# Patient Record
Sex: Female | Born: 1990 | Race: White | Hispanic: No | Marital: Single | Smoking: Heavy tobacco smoker
Health system: Southern US, Community
[De-identification: ages and names within clinical notes are randomized; demographics above are authoritative.]

## PROBLEM LIST (undated history)

## (undated) ENCOUNTER — Inpatient Hospital Stay: Payer: Self-pay

## (undated) DIAGNOSIS — M1990 Unspecified osteoarthritis, unspecified site: Secondary | ICD-10-CM

## (undated) HISTORY — PX: CHOLECYSTECTOMY: SHX55

## (undated) HISTORY — PX: RECONSTRUCTION OF NOSE: SHX2301

## (undated) HISTORY — PX: NASAL FRACTURE SURGERY: SHX718

---
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
## 2008-01-25 ENCOUNTER — Ambulatory Visit: Payer: Self-pay | Admitting: Diagnostic Radiology

## 2008-01-25 ENCOUNTER — Emergency Department (HOSPITAL_BASED_OUTPATIENT_CLINIC_OR_DEPARTMENT_OTHER): Admission: EM | Admit: 2008-01-25 | Discharge: 2008-01-25 | Payer: Self-pay | Admitting: Emergency Medicine

## 2008-01-29 ENCOUNTER — Emergency Department (HOSPITAL_BASED_OUTPATIENT_CLINIC_OR_DEPARTMENT_OTHER): Admission: EM | Admit: 2008-01-29 | Discharge: 2008-01-29 | Payer: Self-pay | Admitting: Emergency Medicine

## 2008-03-18 IMAGING — US ABDOMEN ULTRASOUND
1 series · 17 of 25 positions shown · non-contrast
Comparison: none

REASON FOR EXAM: abd pain    nausea    diarrhea
COMMENTS:

[Series 1: abdomen ultrasound · 17 of 57 slices shown]
[im 1/57]
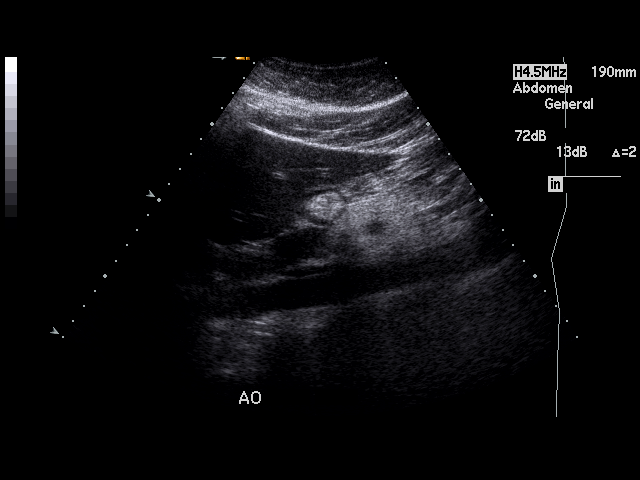
[im 5/57]
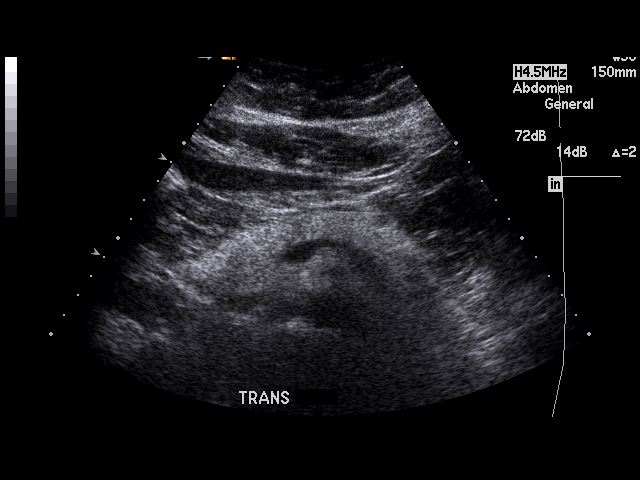
[im 8/57]
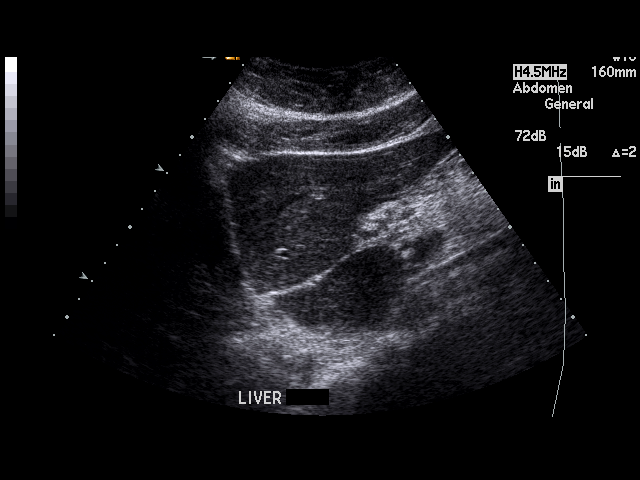
[im 12/57]
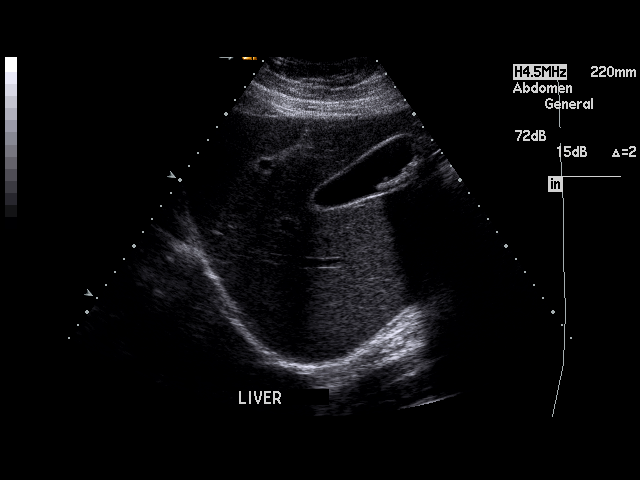
[im 15/57]
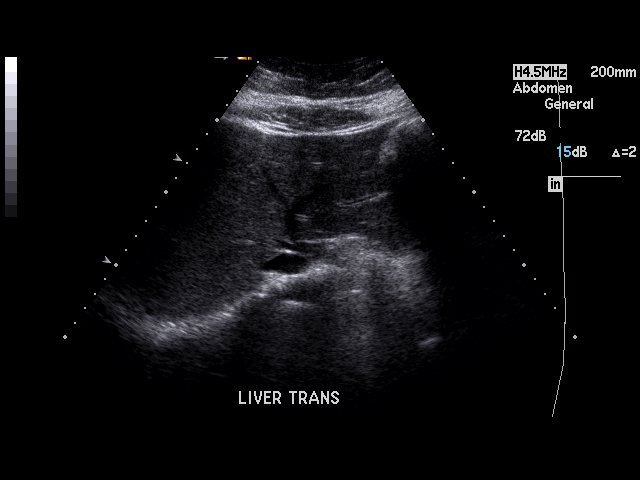
[im 19/57]
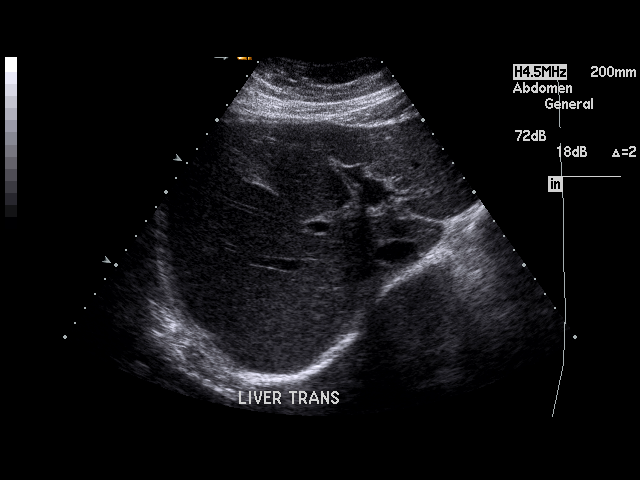
[im 22/57]
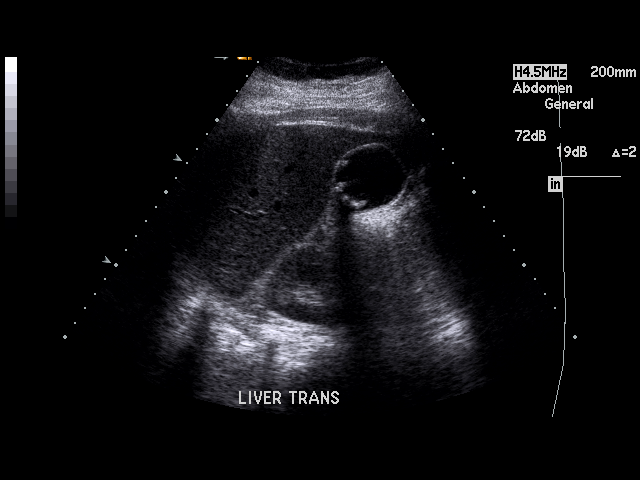
[im 26/57]
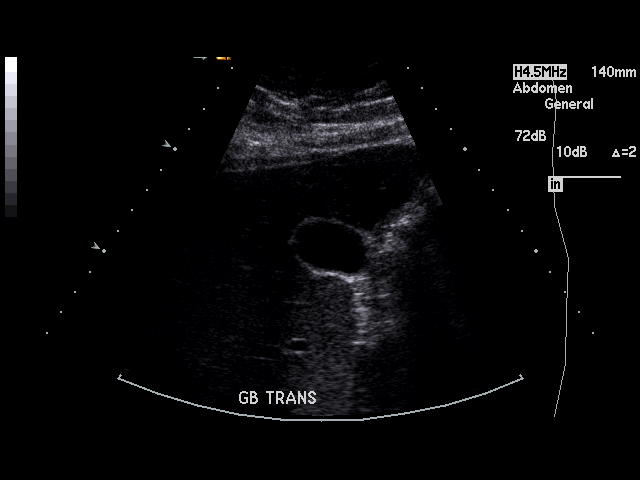
[im 29/57]
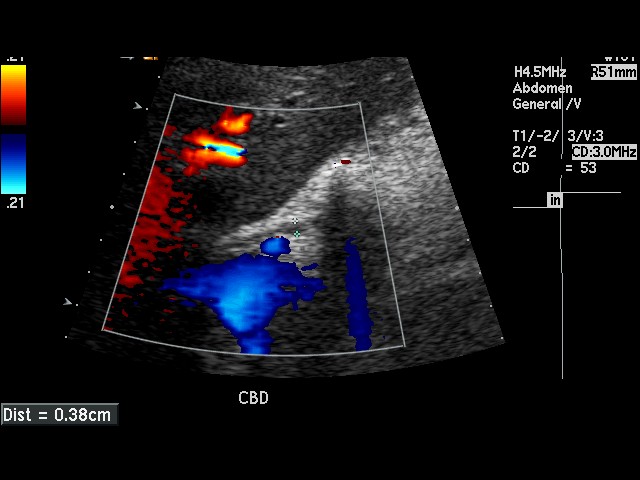
[im 31/57]
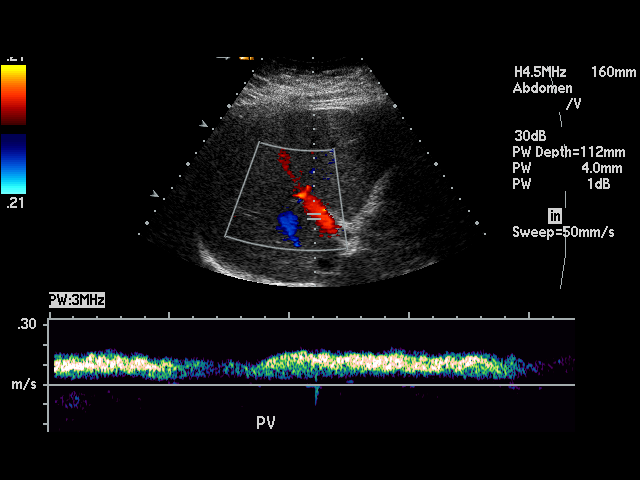
[im 36/57]
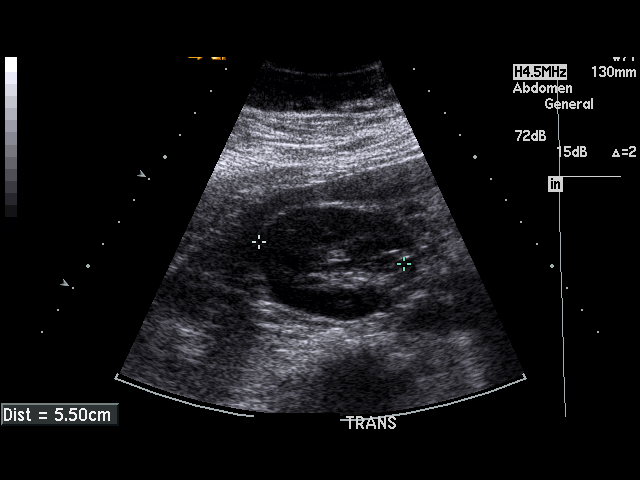
[im 38/57]
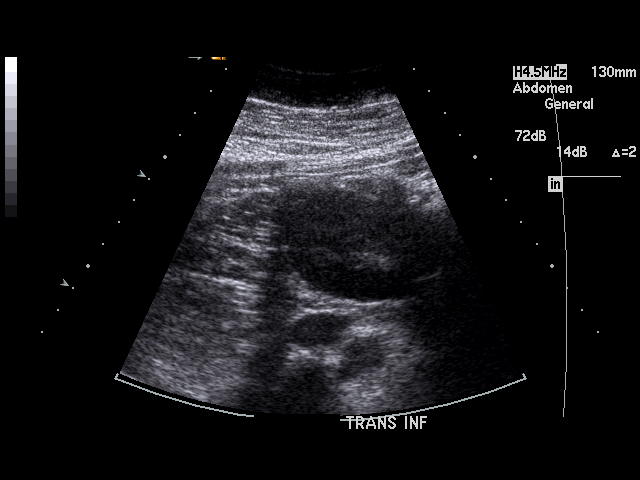
[im 43/57]
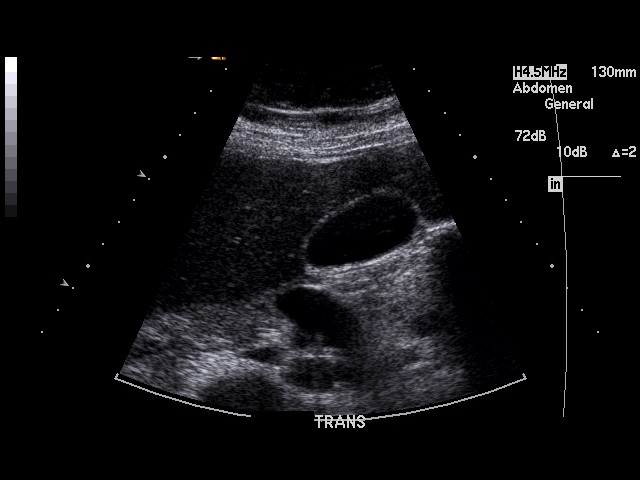
[im 45/57]
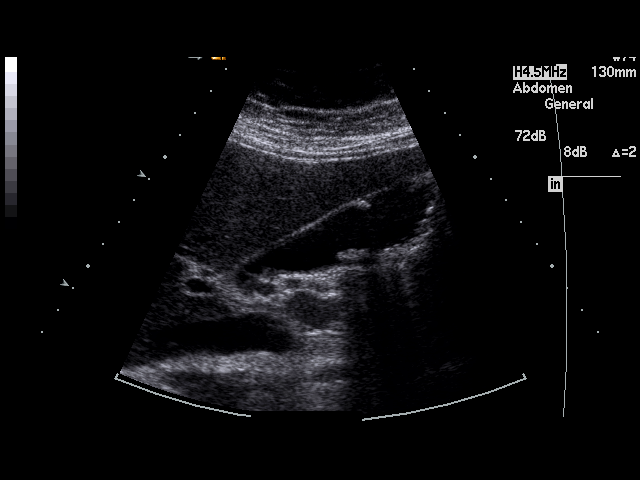
[im 50/57]
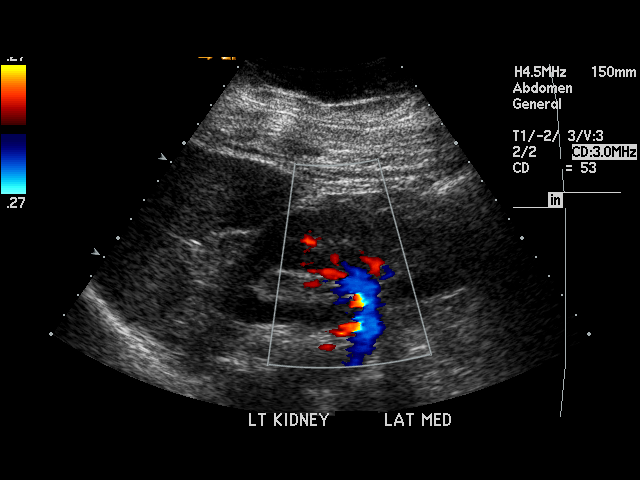
[im 52/57]
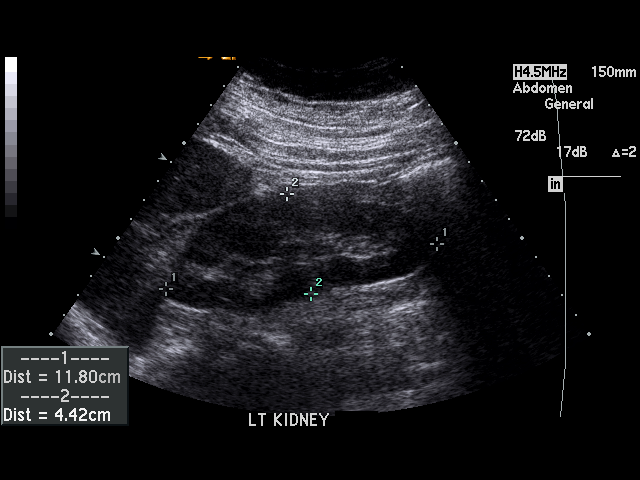
[im 57/57]
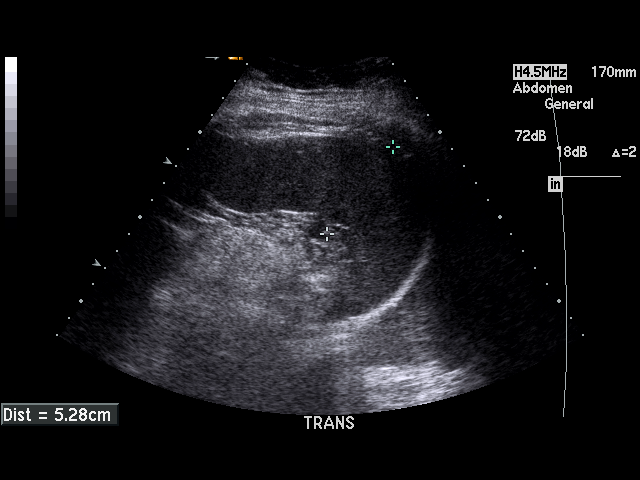

[17 of 25 positions shown; findings below may reference images not displayed]

PROCEDURE:     US  - US ABDOMEN GENERAL SURVEY  - March 01, 2007  [DATE]

RESULT:     Sonographic evaluation of the abdomen demonstrates the presence
of stones within the gallbladder with shadowing. These appear to be mobile.
There is some anterior echogenicity in the gallbladder wall which is
nonmobile. This could represent cholesterolosis or polyposis. The
gallbladder wall thickness is normal at 2.4 mm. The common bile duct
diameter measures 3.8 mm. The pancreas is only partially seen. The liver,
portal venous flow, spleen and kidneys appear to be normal. The visualized
portions of the aorta and inferior vena cava appear to be normal.
IMPRESSION: 1. Cholelithiasis.
2. Possible gallbladder polyp or cholesterolosis.
3. No evidence of acute cholecystitis sonographically.

## 2008-06-23 ENCOUNTER — Emergency Department (HOSPITAL_BASED_OUTPATIENT_CLINIC_OR_DEPARTMENT_OTHER): Admission: EM | Admit: 2008-06-23 | Discharge: 2008-06-23 | Payer: Self-pay | Admitting: Emergency Medicine

## 2008-06-25 ENCOUNTER — Emergency Department (HOSPITAL_BASED_OUTPATIENT_CLINIC_OR_DEPARTMENT_OTHER): Admission: EM | Admit: 2008-06-25 | Discharge: 2008-06-25 | Payer: Self-pay | Admitting: Emergency Medicine

## 2009-01-19 ENCOUNTER — Encounter: Admission: RE | Admit: 2009-01-19 | Discharge: 2009-01-19 | Payer: Self-pay | Admitting: Sports Medicine

## 2009-04-16 ENCOUNTER — Encounter: Admission: RE | Admit: 2009-04-16 | Discharge: 2009-04-16 | Payer: Self-pay | Admitting: Sports Medicine

## 2009-05-18 ENCOUNTER — Encounter: Admission: RE | Admit: 2009-05-18 | Discharge: 2009-05-18 | Payer: Self-pay | Admitting: Sports Medicine

## 2009-09-28 ENCOUNTER — Encounter: Admission: RE | Admit: 2009-09-28 | Discharge: 2009-09-28 | Payer: Self-pay | Admitting: Sports Medicine

## 2009-10-06 ENCOUNTER — Encounter: Admission: RE | Admit: 2009-10-06 | Discharge: 2009-10-06 | Payer: Self-pay | Admitting: Family Medicine

## 2009-10-08 ENCOUNTER — Ambulatory Visit (HOSPITAL_COMMUNITY): Admission: RE | Admit: 2009-10-08 | Discharge: 2009-10-08 | Payer: Self-pay | Admitting: Family Medicine

## 2009-11-04 ENCOUNTER — Encounter: Admission: RE | Admit: 2009-11-04 | Discharge: 2009-11-04 | Payer: Self-pay | Admitting: Family Medicine

## 2010-01-23 ENCOUNTER — Encounter: Payer: Self-pay | Admitting: Sports Medicine

## 2010-01-23 ENCOUNTER — Emergency Department (HOSPITAL_BASED_OUTPATIENT_CLINIC_OR_DEPARTMENT_OTHER)
Admission: EM | Admit: 2010-01-23 | Discharge: 2010-01-23 | Payer: Self-pay | Source: Home / Self Care | Admitting: Emergency Medicine

## 2010-01-25 ENCOUNTER — Ambulatory Visit
Admission: RE | Admit: 2010-01-25 | Discharge: 2010-01-25 | Payer: Self-pay | Source: Home / Self Care | Attending: Sports Medicine | Admitting: Sports Medicine

## 2010-01-25 ENCOUNTER — Ambulatory Visit (HOSPITAL_BASED_OUTPATIENT_CLINIC_OR_DEPARTMENT_OTHER)
Admission: RE | Admit: 2010-01-25 | Discharge: 2010-01-25 | Payer: Self-pay | Source: Home / Self Care | Attending: Family Medicine | Admitting: Family Medicine

## 2010-01-25 DIAGNOSIS — M79609 Pain in unspecified limb: Secondary | ICD-10-CM | POA: Insufficient documentation

## 2010-01-25 LAB — PREGNANCY, URINE: Preg Test, Ur: NEGATIVE

## 2010-01-31 ENCOUNTER — Encounter: Payer: Self-pay | Admitting: Family Medicine

## 2010-02-01 ENCOUNTER — Encounter
Admission: RE | Admit: 2010-02-01 | Discharge: 2010-02-01 | Payer: Self-pay | Source: Home / Self Care | Attending: Sports Medicine | Admitting: Sports Medicine

## 2010-02-01 ENCOUNTER — Ambulatory Visit
Admission: RE | Admit: 2010-02-01 | Discharge: 2010-02-01 | Payer: Self-pay | Source: Home / Self Care | Attending: Family Medicine | Admitting: Family Medicine

## 2010-02-03 NOTE — Assessment & Plan Note (Addendum)
Summary: ED FLUID ON KNEE/DRAIN/MJD   Vital Signs:  Patient profile:   20 year old female Height:      65 inches (165.10 cm) Weight:      270 pounds (122.73 kg) BMI:     45.09 Temp:     97.6 degrees F (36.44 degrees C) oral Pulse rate:   99 / minute BP sitting:   165 / 125  (left arm)  Vitals Entered By: Baxter Hire) (January 25, 2010 11:29 AM) CC: ED Fluid on Right knee Pain Assessment Patient in pain? yes     Location: right knee Intensity: 9 Onset of pain  Has been swollen since Sunday Nutritional Status BMI of > 30 = obese  Does patient need assistance? Functional Status Self care Ambulation Normal   CC:  ED Fluid on Right knee.  History of Present Illness: 20 yo F here with right lower extremity pain  Patient reports that on Sunday early morning she woke up with right lower leg pain This did wake her up from sleep No known injury or remote injury. Slowly worsened since that time. No bruising though she states it feels swollen. No true catching or locking. ROM is limited Is on implanon but no estrogen based birth control No long car rides Went to ED - had x-rays of knee that were negative without fracture, effusion, DJD, or other abnormalities Placed in knee immobilizer and has been wearing this. Declined crutches.  Habits & Providers  Alcohol-Tobacco-Diet     Tobacco Status: current     Cigarette Packs/Day: 0.5  Problems Prior to Update: 1)  Leg Pain, Right  (ICD-729.5)  Allergies (verified): 1)  ! Benadryl 2)  ! Darvocet 3)  ! Vicodin  Family History: + DM grandmother + heart disease grandmother Negative for HTN  Social History: smoker - 1/2 ppd x 3 years no alcohol use MSG servicesSmoking Status:  current Packs/Day:  0.5  Physical Exam  General:  overweight, NAD, ambulates with limp Msk:  R knee: No gross deformity, swelling, or bruising.  No bakers cyst palpated.  No erythema or warmth. TTP diffusely distal quad anteriorly  and posteriorly, posterior knee.  Minimal proximal calf TTP. ROM full extension but limited to 30 degrees flexion 2/2 pain Stable to valgus/varus stress. Negative lachmanns and ant/post drawer (could not bend fully to 90 degrees 2/2 pain per patient). Apleys with pain but not at joint lines. NVI distally. Negative logroll right hip.   Impression & Recommendations:  Problem # 1:  LEG PAIN, RIGHT (ICD-729.5) Patient's insidious onset of pain within right lower leg without injury, posterior leg pain was concerning for possible DVT so doppler u/s done - negative for DVT.  X-rays reviewed and no evidence of DJD - exam not specific to knee joint.  Quick u/s of knee showed no evidence of effusion within knee either.  This should improve with time, medication, physical therapy.  Will discontinue knee immobilizer, start mobic 15mg  once daily, pain medication as given by ED.  Heat for muscle spasms.  Quad strengthening exercises and easy ROM exercises.   Orders: LE Venous Duplex (DVT) (DVT)  Complete Medication List: 1)  Implanon 68 Mg Impl (Etonogestrel) 2)  Percocet 5-325 Mg Tabs (Oxycodone-acetaminophen) .Marland Kitchen.. 1 tab by mouth daily with food three times a day as needed severe pain 3)  Meloxicam 15 Mg Tabs (Meloxicam) .Marland Kitchen.. 1 tab by mouth daily with food x 7 days then as needed 4)  Flexeril 10 Mg Tabs (Cyclobenzaprine hcl) .Marland KitchenMarland KitchenMarland Kitchen  1 tab by mouth three times a day as needed spasms  Patient Instructions: 1)  You ultrasound was negative for a blood clot. 2)  Given the location of your pain without an injury this is most likely severe muscle spasms. 3)  Start meloxicam 15mg  daily with food every day for 1 week then as needed. 4)  Take percocet as needed for severe pain - no driving on this. 5)  Can fill flexeril and take for muscle spasms as needed also - no driving on this. 6)  Heat 15 minutes at a time 3-4 times a day. 7)  Start physical therapy 2x/week for up to 4 weeks. 8)  Make sure to be compliant  with the home exercises and stretches they show you to work this out. 9)  Stop using the immobilizer. 10)  Follow up with me in 4 weeks. Prescriptions: FLEXERIL 10 MG TABS (CYCLOBENZAPRINE HCL) 1 tab by mouth three times a day as needed spasms  #60 x 0   Entered and Authorized by:   Norton Blizzard MD   Signed by:   Norton Blizzard MD on 01/25/2010   Method used:   Print then Give to Patient   RxID:   [id number] MELOXICAM 15 MG TABS (MELOXICAM) 1 tab by mouth daily with food x 7 days then as needed  #30 x 1   Entered and Authorized by:   Norton Blizzard MD   Signed by:   Norton Blizzard MD on 01/25/2010   Method used:   Print then Give to Patient   RxID:   [id number] PERCOCET 5-325 MG TABS (OXYCODONE-ACETAMINOPHEN) 1 tab by mouth daily with food three times a day as needed severe pain  #30 x 0   Entered and Authorized by:   Norton Blizzard MD   Signed by:   Norton Blizzard MD on 01/25/2010   Method used:   Print then Give to Patient   RxID:   [id number] FLEXERIL 10 MG TABS (CYCLOBENZAPRINE HCL) 1 tab by mouth three times a day as needed spasms  #60 x 0   Entered and Authorized by:   Norton Blizzard MD   Signed by:   Norton Blizzard MD on 01/25/2010   Method used:   Print then Give to Patient   RxID:   [id number] MELOXICAM 15 MG TABS (MELOXICAM) 1 tab by mouth daily with food x 7 days then as needed  #30 x 1   Entered and Authorized by:   Norton Blizzard MD   Signed by:   Norton Blizzard MD on 01/25/2010   Method used:   Print then Give to Patient   RxID:   [id number] PERCOCET 5-325 MG TABS (OXYCODONE-ACETAMINOPHEN) 1 tab by mouth daily with food three times a day as needed severe pain  #30 x 0   Entered and Authorized by:   Norton Blizzard MD   Signed by:   Norton Blizzard MD on 01/25/2010   Method used:   Print then Give to Patient   RxID:   [id number]    Orders Added: 1)  LE Venous Duplex (DVT) [DVT] 2)  New Patient Level III [09323]

## 2010-02-09 NOTE — Miscellaneous (Signed)
Summary: Continued pain  Patient called to report pain has continued within her knee but also having episodes of numbness of weakness of right leg.  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  But pain she has is only centered within her knee.  Advised her to make an appointment to see me so we can try to assess if this is coming from her back or her knee and go through the options available.  Norton Blizzard MD  January 31, 2010 11:02 AM

## 2010-02-09 NOTE — Assessment & Plan Note (Signed)
Summary: F/U/LP   Vital Signs:  Patient profile:   20 year old female Height:      65 inches (165.10 cm) Weight:      270 pounds (122.73 kg) BMI:     45.09 Temp:     97.5 degrees F (36.39 degrees C) oral Pulse rate:   105 / minute BP sitting:   150 / 93  (right arm)  Vitals Entered By: Baxter Hire) (February 01, 2010 9:56 AM) CC: o Pain Assessment Patient in pain? yes     Location: Rt. leg Intensity: 10 Onset of pain  pain is worse Nutritional Status fBMI of > 30 = obese  Does patient need assistance? Functional Status Self care Ambulation Normal   CC:  o.  History of Present Illness: 20 yo F here for 1 week f/u right lower extremity pain  Initially, patient reported that on Sunday early morning prior to first visit she woke up with right lower leg pain This did wake her up from sleep No known injury or remote injury. Slowly worsened since that time. No bruising though she states it feels swollen. No true catching or locking. ROM has been limited Went to ED - had x-rays of knee that were negative without fracture, effusion, DJD, or other abnormalities Placed in knee immobilizer initially. Doppler U/s and MSK u/s of her knee were negative Pain is more within her right thigh Also now reporting numbness going throughout right lateral leg to just above knee Associated with weakness, feeling of her leg being 'dead' - lasts up to hours No bowel/bladder dysfunction Has h/o bulging disc in back and has had ESIs in past - last time 6 months ago by Dr. Farris Has No back pain currently  Habits & Providers  Alcohol-Tobacco-Diet     Tobacco Status: current     Cigarette Packs/Day: 0.5  Problems Prior to Update: 1)  Leg Pain, Right  (ICD-729.5)  Medications Prior to Update: 1)  Implanon 68 Mg Impl (Etonogestrel) 2)  Percocet 5-325 Mg Tabs (Oxycodone-Acetaminophen) .Marland Kitchen.. 1 Tab By Mouth Daily With Food Three Times A Day As Needed Severe Pain 3)  Meloxicam 15 Mg Tabs  (Meloxicam) .Marland Kitchen.. 1 Tab By Mouth Daily With Food X 7 Days Then As Needed 4)  Flexeril 10 Mg Tabs (Cyclobenzaprine Hcl) .Marland Kitchen.. 1 Tab By Mouth Three Times A Day As Needed Spasms  Allergies: 1)  ! Benadryl 2)  ! Darvocet 3)  ! Vicodin  Family History: Reviewed history from 01/25/2010 and no changes required. + DM grandmother + heart disease grandmother Negative for HTN  Social History: Reviewed history from 01/25/2010 and no changes required. smoker - 1/2 ppd x 3 years no alcohol use [facility]G services  Physical Exam  General:  overweight, NAD, ambulates with limp Msk:  Back: No gross deformity. No midline or paraspinal TTP TTP lateral right thigh as well as anterior and posterior thigh.  No TTP past knee. 3+/5 hip abduction and 4+/5 hip flexion strength on right.  Other muscles BLEs 5/5 MSRs 2+ and equal in bilateral patellar and achilles tendons Negative log rolls and SLRs.  R knee: No gross deformity, swelling, or bruising.  No bakers cyst palpated.  No erythema or warmth. TTP diffusely distal quad anteriorly and posteriorly, posterior knee. ROM 0 - 90 degrees - will not flex further d/t pain. Stable to valgus/varus stress. Negative apleys. NVI distally.   Impression & Recommendations:  Problem # 1:  LEG PAIN, RIGHT (ICD-729.5) Assessment Deteriorated Doppler u/s negative.  Knee exam (and history) unremarkable.  No mechanism for knee pathology and x-rays negative as well.  With numbness, tingling, weakness of RLE suspect this is a radiculopathic issue.  Will try prednisone dose pack.  If this does not help, advised her to follow up with her back physician for consideration of repeat ESIs.    Complete Medication List: 1)  Implanon 68 Mg Impl (Etonogestrel) 2)  Percocet 5-325 Mg Tabs (Oxycodone-acetaminophen) .Marland Kitchen.. 1 tab by mouth daily with food three times a day as needed severe pain 3)  Flexeril 10 Mg Tabs (Cyclobenzaprine hcl) .Marland Kitchen.. 1 tab by mouth three times a day as needed  spasms 4)  Prednisone (pak) 10 Mg Tabs (Prednisone) .... Take as directed x 12 days  Patient Instructions: 1)  Your symptoms do seem to be more attributed to your back (numbness, weakness, pain in thigh, giving out). 2)  Take prednisone dose pack x 12 days as directed. 3)  Stop the meloxicam. 4)  Ok to take percocet and flexeril with this. 5)  If you are not improving after the dose pack, your next step would be to go back to [facility] for consideration of shots again - they have a partner who does these in the office now. Prescriptions: PREDNISONE (PAK) 10 MG TABS (PREDNISONE) Take as directed x 12 days  #42 x 0   Entered and Authorized by:   Norton Blizzard MD   Signed by:   Norton Blizzard MD on 02/01/2010   Method used:   Print then Give to Patient   RxID:   [id number]    Orders Added: 1)  Est. Patient Level III [38756]

## 2010-05-08 ENCOUNTER — Emergency Department (INDEPENDENT_AMBULATORY_CARE_PROVIDER_SITE_OTHER): Payer: 59

## 2010-05-08 ENCOUNTER — Emergency Department (HOSPITAL_BASED_OUTPATIENT_CLINIC_OR_DEPARTMENT_OTHER)
Admission: EM | Admit: 2010-05-08 | Discharge: 2010-05-08 | Disposition: A | Discharge status: Home / Self Care | Payer: 59 | Attending: Emergency Medicine | Admitting: Emergency Medicine

## 2010-05-08 DIAGNOSIS — W208XXA Other cause of strike by thrown, projected or falling object, initial encounter: Secondary | ICD-10-CM

## 2010-05-08 DIAGNOSIS — S9000XA Contusion of unspecified ankle, initial encounter: Secondary | ICD-10-CM

## 2010-05-08 DIAGNOSIS — W2209XA Striking against other stationary object, initial encounter: Secondary | ICD-10-CM | POA: Insufficient documentation

## 2010-05-08 DIAGNOSIS — Y92009 Unspecified place in unspecified non-institutional (private) residence as the place of occurrence of the external cause: Secondary | ICD-10-CM | POA: Insufficient documentation

## 2010-05-08 DIAGNOSIS — J45909 Unspecified asthma, uncomplicated: Secondary | ICD-10-CM | POA: Insufficient documentation

## 2010-05-08 DIAGNOSIS — S93409A Sprain of unspecified ligament of unspecified ankle, initial encounter: Secondary | ICD-10-CM | POA: Insufficient documentation

## 2010-05-17 NOTE — Discharge Summary (Signed)
NAMEKHRISTI, Natalie Russell              ACCOUNT NO.:  [id number]   MEDICAL RECORD NO.:  [id number]          PATIENT TYPE:  INP   LOCATION:  9132                          FACILITY:  [facility]   PHYSICIAN:  Malachi Pro. Ambrose Mantle, M.D. DATE OF BIRTH:  09/21/1990   DATE OF ADMISSION:  10/30/2006  DATE OF DISCHARGE:  11/02/2006                               DISCHARGE SUMMARY   An 20 year old white female, para 0, gravida 1, 38 weeks and 4 days by  last period compatible with a 6-week ultrasound, with [facility] November 09, 2006.  Presented for induction of labor due to pregnancy-induced high  blood pressure.  Blood pressure was increased to 180/110 in the office  with 3+ proteinuria and no symptoms.  Prenatal care was otherwise  uncomplicated.  The patient was A+ with a negative antibody, RPR  nonreactive, rubella immune, hepatitis B surface antigen negative, HIV  negative, GC and chlamydia negative.  One-hour Glucola 131.  Group B  strep positive.   The patient had a past medical history of asthma.  No known drug  allergies, although later she reported that she was allergic to BENADRYL  and VICODIN.  She had no significant surgical history and took no  medications.   On admission her vital signs were normal except the blood pressure was  140-160 over 60-100.  Fetal heart tones were reactive with irregular  contractions on Pitocin.  The vaginal exam showed the cervix to be 2 cm,  60% vertex at a -2 station.  Artificial rupture of the membranes  produced clear fluid and fetal scalp electrode was placed.  Extremities  showed 2+ edema.  DTRs were 2/4 and there was no clonus.  Heart/lungs  were Normal.  Labs were normal except for uric acid at 5.4.   IMPRESSIONS ON ADMISSION:  Intrauterine pregnancy at 38+ weeks with  pregnancy-induced hypertension.   Labs were acceptable.  Not hyperreflexic.  The patient was on Pitocin.  Artificial rupture of the membranes was done.  By 4:40 a.m. the patient  was comfortable  with an epidural.  Her temperature had risen to 99.9.  Fetal heart tones were reactive.  There was a prolonged deceleration for  5-6 minutes after a couple of minutes of marked variability but it  recovered with position change and oxygen and at 4:40 a.m., there was  moderate variability and mild variable decelerations.  The patient was  having contractions every 1-2 minutes on Pitocin.  She was given  subcutaneous terbutaline and then the contractions were every 3-4  minutes.  At 4:40 a.m. the cervix was 5 cm, completely effaced, vertex  at a -1 station by the RN's exam.  At 4:50 a.m. an IUPC was inserted.  The cervix was 8 cm.  By 7:05 a.m. the cervix was fully dilated, vertex  was at a +2 station.  The patient progressed to full dilatation and  delivered spontaneously ROA over a second-degree midline laceration and  right labial laceration a living female infant, 6 pounds 12 ounces, Apgars  of 8 at one and 9 at five minutes.  Dr. Ambrose Mantle was in attendance.  There  was one loop of nuchal cord.  The placenta was intact.  The uterus could  not be examined.  Rectal was negative.  The body habitus present  prevented a good exam.  I even catheterized the bladder to empty it and  still was unable to evaluate the uterus.  Laceration repaired with 3-0  Vicryl and 3-0 chromic with the nurse's aid in exposure because I could  not expose the area by myself.  Blood loss about 500 mL.  The patient's  blood pressure was significantly elevated to 155 to 160/115, so the  patient was begun on magnesium sulfate and also given labetalol IV to  bring her blood pressure down.  By 5:47 p.m. the blood pressure was  145/94 to 131/76.  The patient had had a few clots from the vagina but  these did not persist.  On the first postpartum day her blood pressure  was 145/92.  Hemoglobin was gone from 11. 7to 9.0.  PIH labs were  normal.  Later in the day of the first postpartum day, the patient's  blood pressures were  130-140 over 80s.  Her urine output was  approximately 1300 mL over an 8-hour period, and magnesium sulfate was  discontinued.  She was transferred to a regular bed.  Over the last 12  hours her blood pressure has remained normal and she is ready for  discharge.   LABORATORY DATA:  Hemoglobin 11.7, hematocrit 33.4, white count 11,900,  platelet count 253,000.  Follow-up hemoglobin 9.0.  PIH labs were  normal.  SGOT and SGPT were 11 and 17 and 11 and 10, LDH was 129 and  153.  Urine did show 100 mg% of protein on October 30, 2006.  RPR was  nonreactive.   FINAL DIAGNOSIS:  Intrauterine pregnancy at 38+ weeks with preeclampsia.   OPERATIONS:  1. Spontaneous delivery, vertex.  2. Repair of second-degree perineal and right labial lacerations.   FINAL CONDITION:  Improved.   Instructions include our regular discharge instruction booklet.  Darvocet-N 100, 30 tablets, one every 4-6 hours as needed for pain.  The  patient is advised to take her blood pressure frequently and to report  blood pressure above 145/95 or any severe headache or epigastric pain.  She is to return to the office in 6 weeks for follow-up examination.      Malachi Pro. Ambrose Mantle, M.D.  Electronically Signed     TFH/MEDQ  D:  11/02/2006  T:  11/02/2006  Job:  [id number]

## 2010-06-06 ENCOUNTER — Emergency Department (HOSPITAL_BASED_OUTPATIENT_CLINIC_OR_DEPARTMENT_OTHER)
Admission: EM | Admit: 2010-06-06 | Discharge: 2010-06-06 | Disposition: A | Discharge status: Home / Self Care | Payer: 59 | Attending: Emergency Medicine | Admitting: Emergency Medicine

## 2010-06-06 DIAGNOSIS — J45909 Unspecified asthma, uncomplicated: Secondary | ICD-10-CM | POA: Insufficient documentation

## 2010-06-06 DIAGNOSIS — L02419 Cutaneous abscess of limb, unspecified: Secondary | ICD-10-CM | POA: Insufficient documentation

## 2010-09-19 ENCOUNTER — Emergency Department (INDEPENDENT_AMBULATORY_CARE_PROVIDER_SITE_OTHER): Payer: 59

## 2010-09-19 ENCOUNTER — Emergency Department (HOSPITAL_BASED_OUTPATIENT_CLINIC_OR_DEPARTMENT_OTHER)
Admission: EM | Admit: 2010-09-19 | Discharge: 2010-09-19 | Disposition: A | Discharge status: Home / Self Care | Payer: 59 | Attending: Emergency Medicine | Admitting: Emergency Medicine

## 2010-09-19 ENCOUNTER — Encounter: Payer: Self-pay | Admitting: *Deleted

## 2010-09-19 DIAGNOSIS — R05 Cough: Secondary | ICD-10-CM

## 2010-09-19 DIAGNOSIS — F17200 Nicotine dependence, unspecified, uncomplicated: Secondary | ICD-10-CM | POA: Insufficient documentation

## 2010-09-19 DIAGNOSIS — R0602 Shortness of breath: Secondary | ICD-10-CM

## 2010-09-19 DIAGNOSIS — J40 Bronchitis, not specified as acute or chronic: Secondary | ICD-10-CM | POA: Insufficient documentation

## 2010-09-19 DIAGNOSIS — R0989 Other specified symptoms and signs involving the circulatory and respiratory systems: Secondary | ICD-10-CM

## 2010-09-19 MED ORDER — ALBUTEROL SULFATE HFA 108 (90 BASE) MCG/ACT IN AERS
2.0000 | INHALATION_SPRAY | RESPIRATORY_TRACT | Status: DC | PRN
  Administered 2010-09-19 (×2): 2 via RESPIRATORY_TRACT
  Filled 2010-09-19: qty 6.7

## 2010-09-19 MED ORDER — ALBUTEROL SULFATE (5 MG/ML) 0.5% IN NEBU
5.0000 mg | INHALATION_SOLUTION | Freq: Once | RESPIRATORY_TRACT | Status: AC
  Administered 2010-09-19: 5 mg via RESPIRATORY_TRACT
  Filled 2010-09-19: qty 1

## 2010-09-19 MED ORDER — IPRATROPIUM BROMIDE 0.02 % IN SOLN
0.5000 mg | Freq: Once | RESPIRATORY_TRACT | Status: AC
  Administered 2010-09-19: 0.5 mg via RESPIRATORY_TRACT
  Filled 2010-09-19: qty 2.5

## 2010-09-19 MED ORDER — PREDNISONE 10 MG PO TABS: 40.0000 mg | ORAL_TABLET | Freq: Every day | ORAL | Status: AC

## 2010-09-19 NOTE — ED Notes (Signed)
Cough runny nose sob and not sleeping. States her muscles are sore from coughing. Has been taking otc Nyquil without relief.

## 2010-09-19 NOTE — ED Provider Notes (Signed)
History     CSN: [id number] Arrival date & time: 09/19/2010  2:29 PM   Chief Complaint  Patient presents with  . URI     (Include location/radiation/quality/duration/timing/severity/associated sxs/prior treatment) Patient is a 20 y.o. female presenting with URI. The history is provided by the patient. No language interpreter was used.  URI The primary symptoms include fever and cough. Primary symptoms do not include sore throat or rash. The current episode started 2 days ago. This is a new problem.  Symptoms associated with the illness include chills and rhinorrhea. The illness is not associated with sinus pressure.     History reviewed. No pertinent past medical history.   Past Surgical History  Procedure Date  . Cholecystectomy   . Nasal fracture surgery     No family history on file.  History  Substance Use Topics  . Smoking status: Current Everyday Smoker -- 0.5 packs/day  . Smokeless tobacco: Not on file  . Alcohol Use: No    OB History    Grav Para Term Preterm Abortions TAB SAB Ect Mult Living                  Review of Systems  Constitutional: Positive for fever and chills.  HENT: Positive for rhinorrhea. Negative for sore throat and sinus pressure.   Respiratory: Positive for cough.   Skin: Negative for rash.  All other systems reviewed and are negative.    Allergies  Diphenhydramine hcl; Hydrocodone-acetaminophen; and Propoxyphene n-acetaminophen  Home Medications  No current outpatient prescriptions on file.  Physical Exam    BP 129/71  Pulse 97  Temp(Src) 98 F (36.7 C) (Oral)  Resp 20  SpO2 97%  Physical Exam  Nursing note and vitals reviewed. Constitutional: She is oriented to person, place, and time. She appears well-developed and well-nourished.  HENT:  Head: Normocephalic and atraumatic.  Right Ear: External ear normal.  Left Ear: External ear normal.  Mouth/Throat: Oropharynx is clear and moist.  Eyes: Pupils are equal,  round, and reactive to light.  Neck: Normal range of motion. Neck supple.  Cardiovascular: Normal rate and regular rhythm.   Pulmonary/Chest: No respiratory distress. She has wheezes.  Musculoskeletal: Normal range of motion.  Neurological: She is alert and oriented to person, place, and time.    ED Course  Procedures  No results found.   Dg Chest 2 View  09/19/2010  *RADIOLOGY REPORT*  Clinical Data: Cough and congestion with shortness of breath and fever.  Morbid obesity.  CHEST - 2 VIEW  Comparison: 02/19/2006.  Findings: Normal heart size with clear lung fields.  No bony abnormality. Prior cholecystectomy.  IMPRESSION: No active disease.  No significant change from priors.  Original Report Authenticated By: Elsie Stain, M.D.     No diagnosis found.   MDM Pt no longer wheezing:will treat for bronhcitis       Teressa Lower, NP 09/19/10 1658

## 2010-09-19 NOTE — ED Notes (Signed)
Pt. Tolerated neb well

## 2010-09-19 NOTE — ED Notes (Signed)
Started Pt. Breathing treatment and she is doing well with treatment..  At times Pt. Does have noted cough

## 2010-09-20 NOTE — ED Provider Notes (Signed)
Medical screening examination/treatment/procedure(s) were performed by non-physician practitioner and as supervising physician I was immediately available for consultation/collaboration.   Deborrah Mabin A. Patrica Duel, MD 09/20/10 1729

## 2010-09-22 LAB — URINALYSIS, ROUTINE W REFLEX MICROSCOPIC
Hgb urine dipstick: NEGATIVE
Protein, ur: NEGATIVE
Urobilinogen, UA: 0.2

## 2010-09-22 LAB — POCT PREGNANCY, URINE
Operator id: 28886
Preg Test, Ur: NEGATIVE

## 2010-10-11 LAB — CBC
MCHC: 34.5
MCV: 91.5
Platelets: 365 — ABNORMAL HIGH
WBC: 9.8

## 2010-10-11 LAB — URINALYSIS, ROUTINE W REFLEX MICROSCOPIC
Bilirubin Urine: NEGATIVE
Nitrite: NEGATIVE
Specific Gravity, Urine: 1.015
Urobilinogen, UA: 0.2

## 2010-10-11 LAB — COMPREHENSIVE METABOLIC PANEL
AST: 16
Albumin: 2.6 — ABNORMAL LOW
Calcium: 9.2
Chloride: 104
Creatinine, Ser: 0.66
GFR calc Af Amer: >60

## 2010-10-11 LAB — URINE MICROSCOPIC-ADD ON

## 2010-10-11 LAB — URIC ACID: Uric Acid, Serum: 6.8

## 2010-10-12 LAB — COMPREHENSIVE METABOLIC PANEL
AST: 13
AST: 15
AST: 17
Albumin: 1.8 — ABNORMAL LOW
Albumin: 2.3 — ABNORMAL LOW
Albumin: 2.5 — ABNORMAL LOW
Alkaline Phosphatase: 69
BUN: 5 — ABNORMAL LOW
CO2: 23
Calcium: 8.9
Calcium: 9
Chloride: 107
Chloride: 108
Creatinine, Ser: 0.44
Creatinine, Ser: 0.44
Creatinine, Ser: 0.45
GFR calc Af Amer: >60
GFR calc Af Amer: >60
GFR calc Af Amer: >60
GFR calc non Af Amer: >60
Glucose, Bld: 74
Glucose, Bld: 76
Potassium: 3.3 — ABNORMAL LOW
Sodium: 135
Total Bilirubin: 0.3
Total Bilirubin: 0.5
Total Protein: 4.3 — ABNORMAL LOW

## 2010-10-12 LAB — URINALYSIS, DIPSTICK ONLY
Glucose, UA: NEGATIVE
Ketones, ur: NEGATIVE
Leukocytes, UA: NEGATIVE
Nitrite: NEGATIVE
Protein, ur: 100 — AB
Urobilinogen, UA: 0.2

## 2010-10-12 LAB — CBC
HCT: 32.4 — ABNORMAL LOW
HCT: 33.4 — ABNORMAL LOW
Hemoglobin: 11.7 — ABNORMAL LOW
MCHC: 35.2
MCV: 89.4
MCV: 90.6
MCV: 90.8
Platelets: 196
Platelets: 253
RBC: 3.57 — ABNORMAL LOW
RBC: 3.91
RDW: 13.2
RDW: 13.6
WBC: 10
WBC: 11.9 — ABNORMAL HIGH
WBC: 8.6

## 2010-10-12 LAB — URIC ACID: Uric Acid, Serum: 5.4

## 2010-10-12 LAB — URINALYSIS, ROUTINE W REFLEX MICROSCOPIC
Nitrite: NEGATIVE
Protein, ur: NEGATIVE
Urobilinogen, UA: 0.2

## 2010-10-12 LAB — LACTATE DEHYDROGENASE
LDH: 129
LDH: 139

## 2010-10-12 LAB — RPR: RPR Ser Ql: NONREACTIVE

## 2011-01-19 ENCOUNTER — Emergency Department (HOSPITAL_BASED_OUTPATIENT_CLINIC_OR_DEPARTMENT_OTHER)
Admission: EM | Admit: 2011-01-19 | Discharge: 2011-01-19 | Disposition: A | Discharge status: Home / Self Care | Payer: Self-pay | Attending: Emergency Medicine | Admitting: Emergency Medicine

## 2011-01-19 ENCOUNTER — Encounter (HOSPITAL_BASED_OUTPATIENT_CLINIC_OR_DEPARTMENT_OTHER): Payer: Self-pay | Admitting: *Deleted

## 2011-01-19 DIAGNOSIS — R109 Unspecified abdominal pain: Secondary | ICD-10-CM | POA: Insufficient documentation

## 2011-01-19 DIAGNOSIS — N946 Dysmenorrhea, unspecified: Secondary | ICD-10-CM | POA: Insufficient documentation

## 2011-01-19 DIAGNOSIS — F17200 Nicotine dependence, unspecified, uncomplicated: Secondary | ICD-10-CM | POA: Insufficient documentation

## 2011-01-19 LAB — COMPREHENSIVE METABOLIC PANEL
AST: 18 U/L (ref 0–37)
Albumin: 3.6 g/dL (ref 3.5–5.2)
CO2: 26 mEq/L (ref 19–32)
Calcium: 9.2 mg/dL (ref 8.4–10.5)
Creatinine, Ser: 0.7 mg/dL (ref 0.50–1.10)
GFR calc non Af Amer: >90 mL/min (ref >90)

## 2011-01-19 LAB — CBC
MCH: 30.9 pg (ref 26.0–34.0)
MCHC: 34.6 g/dL (ref 30.0–36.0)
MCV: 89.5 fL (ref 78.0–100.0)
Platelets: 278 10*3/uL (ref 150–400)
RDW: 12.5 % (ref 11.5–15.5)

## 2011-01-19 LAB — URINALYSIS, ROUTINE W REFLEX MICROSCOPIC
Bilirubin Urine: NEGATIVE
Ketones, ur: NEGATIVE mg/dL
Nitrite: NEGATIVE
Urobilinogen, UA: 1 mg/dL (ref 0.0–1.0)

## 2011-01-19 LAB — DIFFERENTIAL
Basophils Absolute: 0 10*3/uL (ref 0.0–0.1)
Basophils Relative: 0 % (ref 0–1)
Eosinophils Absolute: 0.2 10*3/uL (ref 0.0–0.7)
Eosinophils Relative: 3 % (ref 0–5)

## 2011-01-19 LAB — HCG, QUANTITATIVE, PREGNANCY: hCG, Beta Chain, Quant, S: <1 m[IU]/mL (ref <5)

## 2011-01-19 MED ORDER — TRAMADOL HCL 50 MG PO TABS: 50.0000 mg | ORAL_TABLET | Freq: Four times a day (QID) | ORAL | Status: AC | PRN

## 2011-01-19 MED ORDER — PROMETHAZINE HCL 25 MG PO TABS: 25.0000 mg | ORAL_TABLET | Freq: Three times a day (TID) | ORAL | Status: AC | PRN

## 2011-01-19 MED ORDER — SODIUM CHLORIDE 0.9 % IV BOLUS (SEPSIS)
1000.0000 mL | Freq: Once | INTRAVENOUS | Status: AC
  Administered 2011-01-19: 1000 mL via INTRAVENOUS

## 2011-01-19 MED ORDER — ONDANSETRON HCL 4 MG/2ML IJ SOLN
4.0000 mg | Freq: Once | INTRAMUSCULAR | Status: AC
  Administered 2011-01-19: 4 mg via INTRAVENOUS
  Filled 2011-01-19: qty 2

## 2011-01-19 NOTE — ED Notes (Signed)
Pt amb to room 4 with quick steady gait in nad. Pt reports that she has had intermittant abd cramping x 1 week, states she might be pregnant, period is 17 days late, 4 positive preg tests and 2 neg. Denies any spotting, bleeding or d/c.

## 2011-01-19 NOTE — ED Provider Notes (Signed)
History     CSN: [id number]  Arrival date & time 01/19/11  4098   First MD Initiated Contact with Patient 01/19/11 1033      Chief Complaint  Patient presents with  . Abdominal Cramping    (Consider location/radiation/quality/duration/timing/severity/associated sxs/prior treatment) HPI The patient presents with one week of abdominal pain. She notes that the pain began gradually, since onset has been more present than not. The crampiness is diffuse, not appreciably changed by anything. The patient also complains of mild anorexia with nausea. She denies any vomiting, diarrhea, dysuria, vaginal bleeding or discharge. She denies fevers, chills. Notably, the patient's last menstrual cycle was 1.5 months ago. She requests pregnancy evaluation, and notes that she has taken multiple pregnancy tests with mixed results History reviewed. No pertinent past medical history.  Past Surgical History  Procedure Date  . Cholecystectomy   . Nasal fracture surgery     History reviewed. No pertinent family history.  History  Substance Use Topics  . Smoking status: Current Everyday Smoker -- 0.5 packs/day  . Smokeless tobacco: Not on file  . Alcohol Use: No    OB History    Grav Para Term Preterm Abortions TAB SAB Ect Mult Living                  Review of Systems  Constitutional:       HPI  HENT:       HPI otherwise negative  Eyes: Negative.   Respiratory:       HPI, otherwise negative  Cardiovascular:       HPI, otherwise nmegative  Gastrointestinal: Negative for vomiting.  Genitourinary:       HPI, otherwise negative  Musculoskeletal:       HPI, otherwise negative  Skin: Negative.   Neurological: Negative for syncope.    Allergies  Diphenhydramine hcl; Hydrocodone-acetaminophen; and Propoxyphene n-acetaminophen  Home Medications  No current outpatient prescriptions on file.  BP 127/73  Pulse 89  Temp(Src) 98.2 F (36.8 C) (Oral)  Resp 18  Ht 5\' 5"  (1.651 m)  Wt  290 lb (131.543 kg)  BMI 48.26 kg/m2  SpO2 100%  LMP 12/03/2010  Physical Exam  Nursing note and vitals reviewed. Constitutional: She is oriented to person, place, and time. She appears well-developed and well-nourished. No distress.  HENT:  Head: Normocephalic and atraumatic.  Eyes: Conjunctivae and EOM are normal.  Cardiovascular: Normal rate and regular rhythm.   Pulmonary/Chest: Effort normal and breath sounds normal. No stridor. No respiratory distress.  Abdominal: Normal appearance. She exhibits no distension. There is no hepatosplenomegaly. There is no tenderness. There is no rigidity and no guarding.  Musculoskeletal: She exhibits no edema.  Neurological: She is alert and oriented to person, place, and time. No cranial nerve deficit.  Skin: Skin is warm and dry.  Psychiatric: She has a normal mood and affect.    ED Course  Procedures (including critical care time)   Labs Reviewed  CBC  DIFFERENTIAL  LIPASE, BLOOD  COMPREHENSIVE METABOLIC PANEL  URINALYSIS, ROUTINE W REFLEX MICROSCOPIC  PREGNANCY, URINE  HCG, QUANTITATIVE, PREGNANCY   No results found.   No diagnosis found.  All labs reviewed.  MDM   The patient presents with one week of crampy abdominal pain on exam she is in no distress, has minimal reproducible pain with a soft abdomen and unremarkable vital signs. Notably, the patient is concerned that she is pregnant, given the passage of 7 weeks since her last menstrual cycle. The patient  is not pregnant, and her laboratory evaluation is otherwise unremarkable.: EEG interventions the patient notes mild improvement in her symptoms. Given the patient's continued by mouth tolerance, absence of bowel movement changes, fever or overt signs of infection or systemic illness she is appropriate for discharged with outpatient evaluation of her dysmenorrhea and abdominal pain. All lab results and the preceding thoughts were shared with the patient, and appropriate followup  was advocated.       Gerhard Munch, MD 01/19/11 1325

## 2011-01-24 ENCOUNTER — Emergency Department (HOSPITAL_BASED_OUTPATIENT_CLINIC_OR_DEPARTMENT_OTHER)
Admission: EM | Admit: 2011-01-24 | Discharge: 2011-01-24 | Disposition: A | Discharge status: Home / Self Care | Payer: Self-pay | Attending: Emergency Medicine | Admitting: Emergency Medicine

## 2011-01-24 ENCOUNTER — Encounter (HOSPITAL_BASED_OUTPATIENT_CLINIC_OR_DEPARTMENT_OTHER): Payer: Self-pay

## 2011-01-24 DIAGNOSIS — N760 Acute vaginitis: Secondary | ICD-10-CM | POA: Insufficient documentation

## 2011-01-24 DIAGNOSIS — R109 Unspecified abdominal pain: Secondary | ICD-10-CM | POA: Insufficient documentation

## 2011-01-24 DIAGNOSIS — R112 Nausea with vomiting, unspecified: Secondary | ICD-10-CM | POA: Insufficient documentation

## 2011-01-24 DIAGNOSIS — B9689 Other specified bacterial agents as the cause of diseases classified elsewhere: Secondary | ICD-10-CM | POA: Insufficient documentation

## 2011-01-24 DIAGNOSIS — F17200 Nicotine dependence, unspecified, uncomplicated: Secondary | ICD-10-CM | POA: Insufficient documentation

## 2011-01-24 DIAGNOSIS — A499 Bacterial infection, unspecified: Secondary | ICD-10-CM | POA: Insufficient documentation

## 2011-01-24 DIAGNOSIS — O2390 Unspecified genitourinary tract infection in pregnancy, unspecified trimester: Secondary | ICD-10-CM | POA: Insufficient documentation

## 2011-01-24 LAB — URINALYSIS, ROUTINE W REFLEX MICROSCOPIC
Leukocytes, UA: NEGATIVE
Nitrite: NEGATIVE
Specific Gravity, Urine: 1.017 (ref 1.005–1.030)
pH: 6 (ref 5.0–8.0)

## 2011-01-24 LAB — PREGNANCY, URINE: Preg Test, Ur: NEGATIVE

## 2011-01-24 LAB — DIFFERENTIAL
Eosinophils Relative: 3 % (ref 0–5)
Lymphocytes Relative: 33 % (ref 12–46)
Lymphs Abs: 3.7 10*3/uL (ref 0.7–4.0)
Neutrophils Relative %: 57 % (ref 43–77)

## 2011-01-24 LAB — BASIC METABOLIC PANEL
CO2: 23 mEq/L (ref 19–32)
Glucose, Bld: 101 mg/dL — ABNORMAL HIGH (ref 70–99)
Potassium: 3.8 mEq/L (ref 3.5–5.1)
Sodium: 137 mEq/L (ref 135–145)

## 2011-01-24 LAB — WET PREP, GENITAL: Yeast Wet Prep HPF POC: NONE SEEN

## 2011-01-24 LAB — CBC
MCV: 88.9 fL (ref 78.0–100.0)
Platelets: 285 10*3/uL (ref 150–400)
RBC: 4.77 MIL/uL (ref 3.87–5.11)
WBC: 11.1 10*3/uL — ABNORMAL HIGH (ref 4.0–10.5)

## 2011-01-24 MED ORDER — ONDANSETRON HCL 4 MG/2ML IJ SOLN: 4.0000 mg | Freq: Once | INTRAMUSCULAR | Status: DC

## 2011-01-24 MED ORDER — SODIUM CHLORIDE 0.9 % IV BOLUS (SEPSIS)
1000.0000 mL | Freq: Once | INTRAVENOUS | Status: AC
  Administered 2011-01-24: 1000 mL via INTRAVENOUS

## 2011-01-24 MED ORDER — ONDANSETRON HCL 4 MG/2ML IJ SOLN
8.0000 mg | Freq: Once | INTRAMUSCULAR | Status: AC
  Administered 2011-01-24: 8 mg via INTRAVENOUS
  Filled 2011-01-24: qty 4

## 2011-01-24 MED ORDER — METRONIDAZOLE 500 MG PO TABS: 500.0000 mg | ORAL_TABLET | Freq: Two times a day (BID) | ORAL | Status: AC

## 2011-01-24 MED ORDER — HYOSCYAMINE SULFATE 0.125 MG PO TABS
0.2500 mg | ORAL_TABLET | Freq: Once | ORAL | Status: AC
  Administered 2011-01-24: 0.25 mg via ORAL
  Filled 2011-01-24: qty 2

## 2011-01-24 MED ORDER — PROMETHAZINE HCL 25 MG PO TABS: 25.0000 mg | ORAL_TABLET | Freq: Four times a day (QID) | ORAL | Status: AC | PRN

## 2011-01-24 MED ORDER — DICYCLOMINE HCL 20 MG PO TABS: 20.0000 mg | ORAL_TABLET | Freq: Two times a day (BID) | ORAL | Status: DC

## 2011-01-24 MED ORDER — MORPHINE SULFATE 4 MG/ML IJ SOLN
4.0000 mg | Freq: Once | INTRAMUSCULAR | Status: AC
  Administered 2011-01-24: 4 mg via INTRAVENOUS
  Filled 2011-01-24: qty 1

## 2011-01-24 NOTE — ED Notes (Signed)
Pt. Is being discharged with no distress noted.  Pt. Is able to walk with stedy gait.  Pt. Reports her boyfriend is out front to get her.

## 2011-01-24 NOTE — ED Provider Notes (Signed)
History     CSN: [id number]  Arrival date & time 01/24/11  1427   First MD Initiated Contact with Patient 01/24/11 1439      Chief Complaint  Patient presents with  . Emesis  . Abdominal Pain    (Consider location/radiation/quality/duration/timing/severity/associated sxs/prior treatment) HPI Comments: Concerned that she is pregnant.  States that she has had negative urine HCG with her past pregnancy and had to have blood test and [place] to confirm.  Had negative hcg on 01/19/11  Patient is a 21 y.o. female presenting with vomiting and abdominal pain. The history is provided by the patient. No language interpreter was used.  Emesis  This is a new problem. Episode onset: 3 weeks ago. The problem occurs 2 to 4 times per day. The problem has been gradually worsening. The emesis has an appearance of stomach contents. There has been no fever. Associated symptoms include abdominal pain. Pertinent negatives include no cough, no diarrhea, no fever and no headaches.  Abdominal Pain The primary symptoms of the illness include abdominal pain, nausea and vomiting. The primary symptoms of the illness do not include fever, fatigue, shortness of breath, diarrhea, hematemesis, dysuria, vaginal discharge or vaginal bleeding. Episode onset: 3 weeks ago. The onset of the illness was gradual. The problem has been gradually worsening.  The abdominal pain has been gradually worsening since its onset. Pain Location: entire lower abdomen. The abdominal pain does not radiate. The abdominal pain is relieved by nothing. The abdominal pain is exacerbated by vomiting.  Symptoms associated with the illness do not include constipation, urgency or frequency.    History reviewed. No pertinent past medical history.  Past Surgical History  Procedure Date  . Cholecystectomy   . Nasal fracture surgery     No family history on file.  History  Substance Use Topics  . Smoking status: Current Everyday Smoker -- 0.5  packs/day  . Smokeless tobacco: Not on file  . Alcohol Use: No    OB History    Grav Para Term Preterm Abortions TAB SAB Ect Mult Living                  Review of Systems  Constitutional: Negative for fever, activity change, appetite change and fatigue.  HENT: Negative for congestion, sore throat, rhinorrhea, neck pain and neck stiffness.   Respiratory: Negative for cough and shortness of breath.   Cardiovascular: Negative for chest pain and palpitations.  Gastrointestinal: Positive for nausea, vomiting and abdominal pain. Negative for diarrhea, constipation, blood in stool and hematemesis.  Genitourinary: Positive for pelvic pain. Negative for dysuria, urgency, frequency, flank pain, vaginal bleeding and vaginal discharge.  Neurological: Negative for dizziness, weakness, light-headedness, numbness and headaches.  All other systems reviewed and are negative.    Allergies  Diphenhydramine hcl; Hydrocodone-acetaminophen; and Propoxyphene n-acetaminophen  Home Medications   Current Outpatient Rx  Name Route Sig Dispense Refill  . PROMETHAZINE HCL 25 MG PO TABS Oral Take 1 tablet (25 mg total) by mouth every 8 (eight) hours as needed for nausea. 12 tablet 0  . TRAMADOL HCL 50 MG PO TABS Oral Take 1 tablet (50 mg total) by mouth every 6 (six) hours as needed for pain. 15 tablet 0    BP 157/105  Pulse 98  Temp(Src) 98.7 F (37.1 C) (Oral)  Resp 18  Ht 5\' 5"  (1.651 m)  Wt 290 lb (131.543 kg)  BMI 48.26 kg/m2  SpO2 99%  LMP 12/03/2010  Physical Exam  Nursing note and  vitals reviewed. Constitutional: She is oriented to person, place, and time. She appears well-developed and well-nourished. No distress.       Morbidly obese  HENT:  Head: Normocephalic and atraumatic.  Mouth/Throat: Oropharynx is clear and moist.  Eyes: Conjunctivae and EOM are normal. Pupils are equal, round, and reactive to light.       Lips chapped  Neck: Normal range of motion. Neck supple.    Cardiovascular: Normal rate, regular rhythm, normal heart sounds and intact distal pulses.  Exam reveals no gallop and no friction rub.   No murmur heard. Pulmonary/Chest: Effort normal and breath sounds normal. No respiratory distress.  Abdominal: Soft. Bowel sounds are normal. There is no tenderness. There is no rebound and no guarding.  Genitourinary: Vagina normal. Pelvic exam was performed with patient supine. Cervix exhibits no motion tenderness, no discharge and no friability. Right adnexum displays no mass and no tenderness. Left adnexum displays no mass and no tenderness. No erythema or tenderness around the vagina. No signs of injury around the vagina. No vaginal discharge found.  Musculoskeletal: Normal range of motion. She exhibits no tenderness.  Neurological: She is alert and oriented to person, place, and time. No cranial nerve deficit.  Skin: Skin is warm and dry. No rash noted.    ED Course  Procedures (including critical care time)  Labs Reviewed  CBC - Abnormal; Notable for the following:    WBC 11.1 (*)    All other components within normal limits  BASIC METABOLIC PANEL - Abnormal; Notable for the following:    Glucose, Bld 101 (*)    All other components within normal limits  URINALYSIS, ROUTINE W REFLEX MICROSCOPIC - Abnormal; Notable for the following:    APPearance CLOUDY (*)    All other components within normal limits  WET PREP, GENITAL - Abnormal; Notable for the following:    Clue Cells, Wet Prep MANY (*)    WBC, Wet Prep HPF POC FEW (*)    All other components within normal limits  DIFFERENTIAL  PREGNANCY, URINE  HCG, QUANTITATIVE, PREGNANCY  GC/CHLAMYDIA PROBE AMP, GENITAL   No results found.   1. Abdominal pain   2. Nausea and vomiting       MDM  No evidence of dehydration on urinalysis and metabolic panel. She does have some bacterial vaginosis. Urinalysis is negative. I have no concern for cervicitis or pelvic inflammatory disease at this  time. Patient has a history of pregnancy was negative urinalysis therefore a hcg Quant was obtained. If she is pregnant she'll require a transvaginal ultrasound otherwise she can be safely discharged she tolerates by mouth intake. She'll be discharged home with Phenergan and Flagyl.  Pt signed out to my colleague dr Patrica Duel who will assume care and provide the appropriate dispo        Dayton Bailiff, MD 01/24/11 1558

## 2011-01-24 NOTE — ED Provider Notes (Signed)
History     CSN: [id number]  Arrival date & time 01/24/11  1427   First MD Initiated Contact with Patient 01/24/11 1439      Chief Complaint  Patient presents with  . Emesis  . Abdominal Pain    (Consider location/radiation/quality/duration/timing/severity/associated sxs/prior treatment) HPI  History reviewed. No pertinent past medical history.  Past Surgical History  Procedure Date  . Cholecystectomy   . Nasal fracture surgery     No family history on file.  History  Substance Use Topics  . Smoking status: Current Everyday Smoker -- 0.5 packs/day  . Smokeless tobacco: Not on file  . Alcohol Use: No    OB History    Grav Para Term Preterm Abortions TAB SAB Ect Mult Living                  Review of Systems  Allergies  Diphenhydramine hcl; Hydrocodone-acetaminophen; and Propoxyphene n-acetaminophen  Home Medications   Current Outpatient Rx  Name Route Sig Dispense Refill  . DICYCLOMINE HCL 20 MG PO TABS Oral Take 1 tablet (20 mg total) by mouth 2 (two) times daily. 20 tablet 0  . METRONIDAZOLE 500 MG PO TABS Oral Take 1 tablet (500 mg total) by mouth 2 (two) times daily. 14 tablet 0  . PROMETHAZINE HCL 25 MG PO TABS Oral Take 1 tablet (25 mg total) by mouth every 8 (eight) hours as needed for nausea. 12 tablet 0  . PROMETHAZINE HCL 25 MG PO TABS Oral Take 1 tablet (25 mg total) by mouth every 6 (six) hours as needed for nausea. 10 tablet 0  . TRAMADOL HCL 50 MG PO TABS Oral Take 1 tablet (50 mg total) by mouth every 6 (six) hours as needed for pain. 15 tablet 0    BP 157/105  Pulse 98  Temp(Src) 98.7 F (37.1 C) (Oral)  Resp 18  Ht 5\' 5"  (1.651 m)  Wt 290 lb (131.543 kg)  BMI 48.26 kg/m2  SpO2 99%  LMP 12/03/2010  Physical Exam  ED Course  Procedures (including critical care time)  Labs Reviewed  CBC - Abnormal; Notable for the following:    WBC 11.1 (*)    All other components within normal limits  BASIC METABOLIC PANEL - Abnormal; Notable  for the following:    Glucose, Bld 101 (*)    All other components within normal limits  URINALYSIS, ROUTINE W REFLEX MICROSCOPIC - Abnormal; Notable for the following:    APPearance CLOUDY (*)    All other components within normal limits  WET PREP, GENITAL - Abnormal; Notable for the following:    Clue Cells, Wet Prep MANY (*)    WBC, Wet Prep HPF POC FEW (*)    All other components within normal limits  DIFFERENTIAL  HCG, QUANTITATIVE, PREGNANCY  PREGNANCY, URINE  GC/CHLAMYDIA PROBE AMP, GENITAL   No results found.   1. Abdominal pain   2. Nausea and vomiting   3. BV (bacterial vaginosis)       MDM    Serum pregnancy test was negative. As discussed with Dr. Brooke Dare discharged home with prescriptions for Flagyl, Phenergan, Bentyl. Recommended close followup with primary care provider and to return to ED for any concerns  Initial documentation done by Dr. Audree Bane A. Patrica Duel, MD 01/24/11 [phone]

## 2011-01-24 NOTE — ED Notes (Signed)
Pt reports vomiting x 3 weeks and has abdominal pain.  She reports a positive urine pregnancy test but negative blood test.

## 2011-01-25 LAB — GC/CHLAMYDIA PROBE AMP, GENITAL: Chlamydia, DNA Probe: NEGATIVE

## 2011-06-12 ENCOUNTER — Emergency Department: Payer: Self-pay | Admitting: Emergency Medicine

## 2012-09-14 ENCOUNTER — Encounter (HOSPITAL_BASED_OUTPATIENT_CLINIC_OR_DEPARTMENT_OTHER): Payer: Self-pay | Admitting: Emergency Medicine

## 2012-09-14 ENCOUNTER — Emergency Department (HOSPITAL_BASED_OUTPATIENT_CLINIC_OR_DEPARTMENT_OTHER)
Admission: EM | Admit: 2012-09-14 | Discharge: 2012-09-14 | Disposition: A | Discharge status: Home / Self Care | Payer: Medicaid Other | Attending: Emergency Medicine | Admitting: Emergency Medicine

## 2012-09-14 DIAGNOSIS — Z79899 Other long term (current) drug therapy: Secondary | ICD-10-CM | POA: Insufficient documentation

## 2012-09-14 DIAGNOSIS — K089 Disorder of teeth and supporting structures, unspecified: Secondary | ICD-10-CM | POA: Insufficient documentation

## 2012-09-14 DIAGNOSIS — K0889 Other specified disorders of teeth and supporting structures: Secondary | ICD-10-CM

## 2012-09-14 DIAGNOSIS — F17200 Nicotine dependence, unspecified, uncomplicated: Secondary | ICD-10-CM | POA: Insufficient documentation

## 2012-09-14 DIAGNOSIS — K029 Dental caries, unspecified: Secondary | ICD-10-CM | POA: Insufficient documentation

## 2012-09-14 MED ORDER — CLINDAMYCIN HCL 150 MG PO CAPS
300.0000 mg | ORAL_CAPSULE | Freq: Once | ORAL | Status: AC
  Administered 2012-09-14: 300 mg via ORAL
  Filled 2012-09-14: qty 2

## 2012-09-14 MED ORDER — OXYCODONE-ACETAMINOPHEN 5-325 MG PO TABS
2.0000 | ORAL_TABLET | Freq: Once | ORAL | Status: AC
  Administered 2012-09-14: 2 via ORAL
  Filled 2012-09-14 (×2): qty 2

## 2012-09-14 MED ORDER — CLINDAMYCIN HCL 150 MG PO CAPS: 150.0000 mg | ORAL_CAPSULE | Freq: Three times a day (TID) | ORAL | Status: DC

## 2012-09-14 MED ORDER — OXYCODONE-ACETAMINOPHEN 5-325 MG PO TABS: 1.0000 | ORAL_TABLET | Freq: Four times a day (QID) | ORAL | Status: DC | PRN

## 2012-09-14 NOTE — ED Notes (Signed)
Pt c/o tooth pain x 5-6 hours

## 2012-09-14 NOTE — ED Notes (Signed)
MD at bedside. 

## 2012-09-14 NOTE — ED Provider Notes (Signed)
CSN: [id number]     Arrival date & time 09/14/12  0012 History   First MD Initiated Contact with Patient 09/14/12 0043     Chief Complaint  Patient presents with  . Dental Pain   (Consider location/radiation/quality/duration/timing/severity/associated sxs/prior Treatment) HPI This is a 22 year old female with long-standing history of multiple dental caries. She is here with pain in her right posterior maxilla that began at 5-6 hours ago. The pain was fairly abrupt in onset. The pain is sharp and severe. It is worse with drinking or percussion of the teeth on that side although she cannot localize it to a single-tooth. She is taking ibuprofen i. She has an appointment with her dentist in 3 days.  History reviewed. No pertinent past medical history. Past Surgical History  Procedure Laterality Date  . Cholecystectomy    . Nasal fracture surgery     No family history on file. History  Substance Use Topics  . Smoking status: Current Every Day Smoker -- 0.50 packs/day  . Smokeless tobacco: Not on file  . Alcohol Use: No   OB History   Grav Para Term Preterm Abortions TAB SAB Ect Mult Living                 Review of Systems  All other systems reviewed and are negative.    Allergies  Diphenhydramine hcl; Hydrocodone-acetaminophen; and Propoxyphene-acetaminophen  Home Medications   Current Outpatient Rx  Name  Route  Sig  Dispense  Refill  . EXPIRED: dicyclomine (BENTYL) 20 MG tablet   Oral   Take 1 tablet (20 mg total) by mouth 2 (two) times daily.   20 tablet   0    BP 155/109  Pulse 90  Temp(Src) 98 F (36.7 C) (Oral)  Resp 18  SpO2 100%  Physical Exam General: Well-developed, obese female in no acute distress; appearance consistent with age of record HENT: normocephalic; atraumatic; widespread dental decay; tenderness to percussion of right upper molars without adjacent swelling or erythema of the gum tissue Eyes: pupils equal, round and reactive to light;  extraocular muscles intact Neck: supple; mild right anterior cervical lymphadenopathy Heart: regular rate and rhythm Lungs: clear to auscultation bilaterally Abdomen: soft; obese Extremities: No deformity; full range of motion Neurologic: Awake, alert and oriented; motor function intact in all extremities and symmetric; no facial droop Skin: Warm and dry Psychiatric: Normal mood and affect    ED Course  Procedures (including critical care time)  MDM       Hanley Seamen, MD 09/14/12 [phone]

## 2012-09-14 NOTE — ED Notes (Signed)
D/c home- rx x 2 given for clindamycin and percocet

## 2012-11-01 ENCOUNTER — Emergency Department (HOSPITAL_BASED_OUTPATIENT_CLINIC_OR_DEPARTMENT_OTHER)
Admission: EM | Admit: 2012-11-01 | Discharge: 2012-11-01 | Disposition: A | Discharge status: Home / Self Care | Payer: Medicaid Other | Attending: Emergency Medicine | Admitting: Emergency Medicine

## 2012-11-01 ENCOUNTER — Encounter (HOSPITAL_BASED_OUTPATIENT_CLINIC_OR_DEPARTMENT_OTHER): Payer: Self-pay | Admitting: Emergency Medicine

## 2012-11-01 DIAGNOSIS — Z792 Long term (current) use of antibiotics: Secondary | ICD-10-CM | POA: Insufficient documentation

## 2012-11-01 DIAGNOSIS — K047 Periapical abscess without sinus: Secondary | ICD-10-CM | POA: Insufficient documentation

## 2012-11-01 DIAGNOSIS — K089 Disorder of teeth and supporting structures, unspecified: Secondary | ICD-10-CM | POA: Insufficient documentation

## 2012-11-01 DIAGNOSIS — F17200 Nicotine dependence, unspecified, uncomplicated: Secondary | ICD-10-CM | POA: Insufficient documentation

## 2012-11-01 DIAGNOSIS — K006 Disturbances in tooth eruption: Secondary | ICD-10-CM | POA: Insufficient documentation

## 2012-11-01 DIAGNOSIS — K0889 Other specified disorders of teeth and supporting structures: Secondary | ICD-10-CM

## 2012-11-01 MED ORDER — OXYCODONE-ACETAMINOPHEN 5-325 MG PO TABS
1.0000 | ORAL_TABLET | Freq: Once | ORAL | Status: AC
  Administered 2012-11-01: 1 via ORAL
  Filled 2012-11-01: qty 1

## 2012-11-01 MED ORDER — PENICILLIN V POTASSIUM 500 MG PO TABS: 500.0000 mg | ORAL_TABLET | Freq: Four times a day (QID) | ORAL | Status: AC

## 2012-11-01 NOTE — ED Provider Notes (Signed)
CSN: [id number]     Arrival date & time 11/01/12  1954 History   This chart was scribed for Candyce Churn, MD by Ronal Fear, ED Scribe. This patient was seen in room MH01/MH01 and the patient's care was started at 8:35 PM.    Chief Complaint  Patient presents with  . Dental Pain    Patient is a 22 y.o. female presenting with tooth pain. The history is provided by the patient. No language interpreter was used.  Dental Pain Location:  Upper Upper teeth location:  4/RU 2nd bicuspid and 3/RU 1st molar Quality:  Lambert Mody Severity:  Mild Onset quality:  Gradual Duration:  2 months Timing:  Intermittent Progression:  Worsening Context: abscess and poor dentition   Relieved by:  Nothing Worsened by:  Hot food/drink and cold food/drink Ineffective treatments:  NSAIDs Associated symptoms: no congestion, no facial pain, no facial swelling, no fever, no headaches, no neck pain and no neck swelling    Pt has an abscess tooth she has scheduled an appointment on the 10 th of this month with her dentist. Pt has taken Anbesol with relief and ibuprofen at 5:30 with no relief. The sharp pain has grown worse today but does not radiate. She has had prior occurrences. She denies ear pain, and fever.  History reviewed. No pertinent past medical history. Past Surgical History  Procedure Laterality Date  . Cholecystectomy    . Nasal fracture surgery     No family history on file. History  Substance Use Topics  . Smoking status: Light Tobacco Smoker  . Smokeless tobacco: Not on file  . Alcohol Use: No   OB History   Grav Para Term Preterm Abortions TAB SAB Ect Mult Living                 Review of Systems  Constitutional: Negative for fever.  HENT: Positive for dental problem. Negative for congestion, ear pain, facial swelling and sore throat.   Musculoskeletal: Negative for neck pain.  Neurological: Negative for headaches.  All other systems reviewed and are negative.    Allergies   Hydrocodone-acetaminophen; Propoxyphene-acetaminophen; and Red dye  Home Medications   Current Outpatient Rx  Name  Route  Sig  Dispense  Refill  . clindamycin (CLEOCIN) 150 MG capsule   Oral   Take 1 capsule (150 mg total) by mouth 3 (three) times daily.   20 capsule   0   . EXPIRED: dicyclomine (BENTYL) 20 MG tablet   Oral   Take 1 tablet (20 mg total) by mouth 2 (two) times daily.   20 tablet   0   . oxyCODONE-acetaminophen (PERCOCET) 5-325 MG per tablet   Oral   Take 1-2 tablets by mouth every 6 (six) hours as needed for pain.   20 tablet   0    BP 152/90  Pulse 100  Temp(Src) 97.7 F (36.5 C) (Oral)  Resp 21  Ht 5\' 5"  (1.651 m)  Wt 270 lb (122.471 kg)  BMI 44.93 kg/m2  SpO2 100% Physical Exam  Nursing note and vitals reviewed. Constitutional: She is oriented to person, place, and time. She appears well-developed and well-nourished. No distress.  HENT:  Head: Normocephalic and atraumatic.  Mouth/Throat:    Poor dentition.  No fluctuance, draining.  No trismus.  No soft palate tenderness or fullness.    Eyes: Conjunctivae are normal. No scleral icterus.  Neck: Neck supple.  Cardiovascular: Normal rate and intact distal pulses.   Pulmonary/Chest: Effort normal.  No stridor. No respiratory distress.  Abdominal: Normal appearance. She exhibits no distension.  Neurological: She is alert and oriented to person, place, and time.  Skin: Skin is warm and dry. No rash noted.  Psychiatric: She has a normal mood and affect. Her behavior is normal.    ED Course  Procedures (including critical care time)  DIAGNOSTIC STUDIES: Oxygen Saturation is 100% on RA, normal by my interpretation.    COORDINATION OF CARE:  8:39 PM- Pt advised of plan for treatment including pain medication and penicillinand pt agrees.    Labs Review Labs Reviewed - No data to display Imaging Review No results found.  EKG Interpretation   None       MDM   1. Pain, dental     22 yo female with recurrent dental pain.  Treated with PCN last month with resolution of pain until now.  She sees her dentist in 10 days.  Response to PCN may be indicative of recurrent abscess, so will treat with PCN again today.  Will give dose of Percocet in ED, but will not discharge with narcotics.  Advised NSAIDs and tylenol as needed.  Advised not to abuse the anbesol.    I personally performed the services described in this documentation, which was scribed in my presence. The recorded information has been reviewed and is accurate.     Candyce Churn, MD 11/01/12 [phone]

## 2012-11-01 NOTE — ED Notes (Signed)
Pt reports about 1.5 months of dental pain.  Was seen here for same but dentist unable to get in for eval Nov 10.

## 2012-11-01 NOTE — ED Notes (Signed)
Complaining of right upper molar pain, dental appt on Nov 10th, need pain management and requesting for antibiotic prior to dental appointment. Denied other complaints.

## 2013-01-04 ENCOUNTER — Emergency Department (INDEPENDENT_AMBULATORY_CARE_PROVIDER_SITE_OTHER)
Admission: EM | Admit: 2013-01-04 | Discharge: 2013-01-04 | Disposition: A | Discharge status: Home / Self Care | Payer: Self-pay | Source: Home / Self Care

## 2013-01-04 ENCOUNTER — Encounter (HOSPITAL_COMMUNITY): Payer: Self-pay | Admitting: Emergency Medicine

## 2013-01-04 DIAGNOSIS — M25561 Pain in right knee: Secondary | ICD-10-CM

## 2013-01-04 DIAGNOSIS — M25569 Pain in unspecified knee: Secondary | ICD-10-CM

## 2013-01-04 MED ORDER — TRAMADOL HCL 50 MG PO TABS: 50.0000 mg | ORAL_TABLET | Freq: Four times a day (QID) | ORAL | Status: DC | PRN

## 2013-01-04 NOTE — Discharge Instructions (Signed)
Thank you for coming in today Please purchase a neoprene or similar knee brace for the right knee Please start taking ibuprofen 800mg  every 6 hours Please rest and elevate your leg and appply ice or heat as needed. THis should resolve in another 2-4 weeks.  Quadriceps Strain with Rehab A strain is a tear in a muscle or the tendon that attaches the muscle to bone. A quadriceps strain is a tear in the muscles on the front of the thigh (quadriceps muscles) or their tendons. The quadriceps muscles are important for straightening the knee and bending the hip. The condition is characterized by pain, inflammation, and reduced function of these muscles. Strains are classified into three categories. Grade 1 strains cause pain, but the tendon is not lengthened. Grade 2 strains include a lengthened ligament due to the ligament being stretched or partially ruptured. With grade 2 strains there is still function, although the function may be diminished. Grade 3 strains are characterized by a complete tear of the tendon or muscle, and function is usually impaired.  SYMPTOMS   Pain, tenderness, inflammation, and/or bruising (contusion) over the quadriceps muscles  Pain that worsens with use of the quadriceps muscles.  Muscle spasm in the thigh.  Difficulty with common tasks that involve the quadriceps muscle, such as walking.  A crackling sound (crepitation) when the tendon is moved or touched.  Loss of fullness of the muscle or bulging within the area of muscle with complete rupture. CAUSES  A strain occurs when a force is placed on the muscle or tendon that is greater than it can withstand. Common mechanisms of injury include:  Repetitive strenuous use of the quadriceps muscles. This may be due to an increase in the intensity, frequency, or duration of exercise.  Direct trauma to the quadriceps muscles or tendons. RISK INCREASES WITH:  Activities that involve forceful contractions of the quadriceps  muscles (jumping or sprinting).  Contact sports (soccer or football).  Poor strength and flexibility.  Failure to warm-up properly before activity.  Previous injury to the thigh or knee. PREVENTION  Warm up and stretch properly before activity.  Allow for adequate recovery between workouts.  Maintain physical fitness:  Strength, flexibility, and endurance.  Cardiovascular fitness.  Wear properly fitted and padded protective equipment. PROGNOSIS  If treated properly, then quadriceps muscles strains are usually curable within 6 weeks.  RELATED COMPLICATIONS   Prolonged healing time, if improperly treated or re-injured.  Recurrent symptoms that result in a chronic problem.  Recurrence of symptoms if activity is resumed too soon. TREATMENT  Treatment initially involves the use of ice and medication to help reduce pain and inflammation. The use of strengthening and stretching exercises may help reduce pain with activity. These exercises may be performed at home or with referral to a therapist. Crutches may be recommended to allow the muscle to rest until walking can be completed without limping. Surgery is rarely necessary for this injury, but may be considered if the injury involves a grade 3 strain, or if symptoms persist for greater than 3 months despite non-surgical (conservative) treatment.  MEDICATION  If pain medication is necessary, then nonsteroidal anti-inflammatory medications, such as aspirin and ibuprofen, or other minor pain relievers, such as acetaminophen, are often recommended.  Do not take pain medication for 7 days before surgery.  Prescription pain relievers may be given if deemed necessary by your caregiver. Use only as directed and only as much as you need.  Ointments applied to the skin may be  helpful.  Corticosteroid injections may be given by your caregiver. These injections should be reserved for the most serious cases, because they may only be given a  certain number of times. HEAT AND COLD  Cold treatment (icing) relieves pain and reduces inflammation. Cold treatment should be applied for 10 to 15 minutes every 2 to 3 hours for inflammation and pain and immediately after any activity that aggravates your symptoms. Use ice packs or massage the area with a piece of ice (ice massage).  Heat treatment may be used prior to performing the stretching and strengthening activities prescribed by your caregiver, physical therapist, or athletic trainer. Use a heat pack or soak the injury in warm water. SEEK MEDICAL CARE IF:  Treatment seems to offer no benefit, or the condition worsens.  Any medications produce adverse side effects. EXERCISES  RANGE OF MOTION (ROM) AND STRETCHING EXERCISES - Quadriceps Strain These exercises may help you when beginning to rehabilitate your injury. Your symptoms may resolve with or without further involvement from your physician, physical therapist or athletic trainer. While completing these exercises, remember:   Restoring tissue flexibility helps normal motion to return to the joints. This allows healthier, less painful movement and activity.  An effective stretch should be held for at least 30 seconds.  A stretch should never be painful. You should only feel a gentle lengthening or release in the stretched tissue. RANGE OF MOTION - Knee Flexion, Active  Lie on your back with both knees straight. (If this causes back discomfort, bend your opposite knee, placing your foot flat on the floor.)  Slowly slide your heel back toward your buttocks until you feel a gentle stretch in the front of your knee or thigh.  Hold for __________ seconds. Slowly slide your heel back to the starting position. Repeat __________ times. Complete this exercise __________ times per day.  STRETCH - Quadriceps, Prone  Lie on your stomach on a firm surface, such as a bed or padded floor.  Bend your right / left knee and grasp your ankle.  If you are unable to reach, your ankle or pant leg, use a belt around your foot to lengthen your reach.  Gently pull your heel toward your buttocks. Your knee should not slide out to the side. You should feel a stretch in the front of your thigh and/or knee.  Hold this position for __________ seconds. Repeat __________ times. Complete this stretch __________ times per day.  STRETCHING - Hip Flexors, Lunge  Half kneel with your right / left knee on the floor and your opposite knee bent and directly over your ankle.  Keep good posture with your head over your shoulders. Tighten your buttocks to point your tailbone downward; this will prevent your back from arching too much.  You should feel a gentle stretch in the front of your thigh and/or hip. If you do not feel any resistance, slightly slide your opposite foot forward and then slowly lunge forward so your knee once again lines up over your ankle. Be sure your tailbone remains pointed downward.  Hold this stretch for __________ seconds. Repeat __________ times. Complete this stretch __________ times per day. STRENGTHENING EXERCISES - Quadriceps Strain These exercises may help you when beginning to rehabilitate your injury. They may resolve your symptoms with or without further involvement from your physician, physical therapist or athletic trainer. While completing these exercises, remember:   Muscles can gain both the endurance and the strength needed for everyday activities through controlled exercises.  Complete these exercises as instructed by your physician, physical therapist or athletic trainer. Progress the resistance and repetitions only as guided. STRENGTH - Quadriceps, Isometrics  Lie on your back with your right / left leg extended and your opposite knee bent.  Gradually tense the muscles in the front of your right / left thigh. You should see either your knee cap slide up toward your hip or increased dimpling just above the  knee. This motion will push the back of the knee down toward the floor/mat/bed on which you are lying.  Hold the muscle as tight as you can without increasing your pain for __________ seconds.  Relax the muscles slowly and completely in between each repetition. Repeat __________ times. Complete this exercise __________ times per day.  STRENGTH - Quadriceps, Short Arcs   Lie on your back. Place a __________ inch towel roll under your knee so that the knee slightly bends.  Raise only your lower leg by tightening the muscles in the front of your thigh. Do not allow your thigh to rise.  Hold this position for __________ seconds. Repeat __________ times. Complete this exercise __________ times per day.  OPTIONAL ANKLE WEIGHTS: Begin with ____________________, but DO NOT exceed ____________________. Increase in1 lb/0.5 kg increments. STRENGTH - Quadriceps, Straight Leg Raises  Quality counts! Watch for signs that the quadriceps muscle is working to insure you are strengthening the correct muscles and not "cheating" by substituting with healthier muscles.  Lay on your back with your right / left leg extended and your opposite knee bent.  Tense the muscles in the front of your right / left thigh. You should see either your knee cap slide up or increased dimpling just above the knee. Your thigh may even quiver.  Tighten these muscles even more and raise your leg 4 to 6 inches off the floor. Hold for __________ seconds.  Keeping these muscles tense, lower your leg.  Relax the muscles slowly and completely in between each repetition. Repeat __________ times. Complete this exercise __________ times per day.  STRENGTH - Quadriceps, Wall Slides  Follow guidelines for form closely. Increased knee pain often results from poorly placed feet or knees.  Lean against a smooth wall or door and walk your feet out 18-24 inches. Place your feet hip-width apart.  Slowly slide down the wall or door until your  knees bend __________ degrees.* Keep your knees over your heels, not your toes, and in line with your hips, not falling to either side.  Hold for __________ seconds. Stand up to rest for __________ seconds in between each repetition. Repeat __________ times. Complete this exercise __________ times per day. * Your physician, physical therapist or athletic trainer will alter this angle based on your symptoms and progress. STRENGTH - Quadriceps, Step-Ups   Use a thick book, step or step stool that is __________ inches tall.  Holding a wall or counter for balance only, not support.  Slowly step-up with your right / left foot, keeping your knee in line with your hip and foot. Do not allow your knee to bend so far that you cannot see your toes.  Slowly unlock your knee and lower yourself to the starting position. Your muscles, not gravity, should lower you. Repeat __________ times. Complete this exercise __________ times per day. Document Released: 12/19/2004 Document Revised: 03/13/2011 Document Reviewed: 04/02/2008 [facility] Patient Information 2014 [place], [place].

## 2013-01-04 NOTE — ED Notes (Signed)
C/o R knee pain off and one with swelling off and on.  She has tried ice and elevation.  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

## 2013-01-04 NOTE — ED Provider Notes (Signed)
CSN: [id number]     Arrival date & time 01/04/13  1442 History   None    Chief Complaint  Patient presents with  . Knee Pain   (Consider location/radiation/quality/duration/timing/severity/associated sxs/prior Treatment) HPI  Knee pain: started 4 wks ago. Mild fluid in R knee. orignially off and on but now constant. Worse w/ ambulation. Improves w/ rest and elevation. Throbbing. Gradual onset. 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 Ibuprofen 800mg  PRN w/ only mild benefit. No change in physical activity. 60lb wt loss recently primarily from diet.   History reviewed. No pertinent past medical history. Past Surgical History  Procedure Laterality Date  . Cholecystectomy    . Nasal fracture surgery     History reviewed. No pertinent family history. History  Substance Use Topics  . Smoking status: Heavy Tobacco Smoker -- 1.00 packs/day    Types: Cigarettes  . Smokeless tobacco: Not on file  . Alcohol Use: No   OB History   Grav Para Term Preterm Abortions TAB SAB Ect Mult Living                 Review of Systems  Constitutional: Positive for activity change.  Respiratory: Negative for shortness of breath.   Cardiovascular: Negative for chest pain.  All other systems reviewed and are negative.    Allergies  Hydrocodone-acetaminophen; Propoxyphene n-acetaminophen; and Red dye  Home Medications   Current Outpatient Rx  Name  Route  Sig  Dispense  Refill  . clindamycin (CLEOCIN) 150 MG capsule   Oral   Take 1 capsule (150 mg total) by mouth 3 (three) times daily.   20 capsule   0   . EXPIRED: dicyclomine (BENTYL) 20 MG tablet   Oral   Take 1 tablet (20 mg total) by mouth 2 (two) times daily.   20 tablet   0   . oxyCODONE-acetaminophen (PERCOCET) 5-325 MG per tablet   Oral   Take 1-2 tablets by mouth every 6 (six) hours as needed for pain.   20 tablet   0   . traMADol (ULTRAM) 50 MG tablet   Oral   Take  1 tablet (50 mg total) by mouth every 6 (six) hours as needed.   30 tablet   0    BP 140/97  Pulse 90  Temp(Src) 98.4 F (36.9 C) (Oral)  Resp 18  SpO2 100%  LMP 11/04/2012 Physical Exam  Constitutional: She is oriented to person, place, and time. She appears well-developed and well-nourished. No distress.  HENT:  Head: Normocephalic and atraumatic.  Eyes: EOM are normal. Pupils are equal, round, and reactive to light.  Neck: Normal range of motion. Neck supple.  Pulmonary/Chest: Effort normal and breath sounds normal.  Abdominal: Soft.  Musculoskeletal:  R knee FROM. Tender to palpation along the lateral knee joint space. Pain on valgus stresses. lochman's negative. No crepitus or apprehension.   Neurological: She is alert and oriented to person, place, and time.  Skin: Skin is warm and dry. No rash noted. No erythema. No pallor.  Psychiatric: She has a normal mood and affect. Her behavior is normal. Judgment and thought content normal.    ED Course  Procedures (including critical care time) Labs Review Labs Reviewed - No data to display Imaging Review No results found.  EKG Interpretation    Date/Time:    Ventricular Rate:    PR Interval:    QRS Duration:   QT Interval:  QTC Calculation:   R Axis:     Text Interpretation:              MDM   1. Knee pain, right    23 yo obese female w/ MSK knee pain. No signs of overt arthritis or injury. Likely to resolve w/ rest, elevation, ice and heat, time, NSAIDs. Also recommended neoprene sleeve PRN for heavy activity. Tramadol at night for sleep. No need for xray or tap or steroid injection at this time  Shelly Flattenavid Merrell, MD Family Medicine PGY-3 01/04/2013, 6:27 PM     Ozella Rocksavid J Merrell, MD 01/04/13 [phone]

## 2013-01-05 ENCOUNTER — Emergency Department (HOSPITAL_BASED_OUTPATIENT_CLINIC_OR_DEPARTMENT_OTHER)
Admission: EM | Admit: 2013-01-05 | Discharge: 2013-01-05 | Disposition: A | Discharge status: Home / Self Care | Payer: Medicaid Other | Attending: Emergency Medicine | Admitting: Emergency Medicine

## 2013-01-05 ENCOUNTER — Emergency Department (HOSPITAL_BASED_OUTPATIENT_CLINIC_OR_DEPARTMENT_OTHER): Payer: Medicaid Other

## 2013-01-05 ENCOUNTER — Encounter (HOSPITAL_BASED_OUTPATIENT_CLINIC_OR_DEPARTMENT_OTHER): Payer: Self-pay | Admitting: Emergency Medicine

## 2013-01-05 DIAGNOSIS — S8990XA Unspecified injury of unspecified lower leg, initial encounter: Secondary | ICD-10-CM | POA: Insufficient documentation

## 2013-01-05 DIAGNOSIS — M25561 Pain in right knee: Secondary | ICD-10-CM

## 2013-01-05 DIAGNOSIS — X500XXA Overexertion from strenuous movement or load, initial encounter: Secondary | ICD-10-CM | POA: Insufficient documentation

## 2013-01-05 DIAGNOSIS — S99929A Unspecified injury of unspecified foot, initial encounter: Principal | ICD-10-CM

## 2013-01-05 DIAGNOSIS — Y9301 Activity, walking, marching and hiking: Secondary | ICD-10-CM | POA: Insufficient documentation

## 2013-01-05 DIAGNOSIS — S99919A Unspecified injury of unspecified ankle, initial encounter: Principal | ICD-10-CM

## 2013-01-05 DIAGNOSIS — F17200 Nicotine dependence, unspecified, uncomplicated: Secondary | ICD-10-CM | POA: Insufficient documentation

## 2013-01-05 DIAGNOSIS — Z79899 Other long term (current) drug therapy: Secondary | ICD-10-CM | POA: Insufficient documentation

## 2013-01-05 DIAGNOSIS — Y929 Unspecified place or not applicable: Secondary | ICD-10-CM | POA: Insufficient documentation

## 2013-01-05 DIAGNOSIS — M129 Arthropathy, unspecified: Secondary | ICD-10-CM | POA: Insufficient documentation

## 2013-01-05 HISTORY — DX: Unspecified osteoarthritis, unspecified site: M19.90

## 2013-01-05 NOTE — ED Notes (Signed)
Pt reports pain in right knee, she was seen at Urgent Care yesterday and told she has "fluid on knee" and arthritis.  States she was ambulating last night felt a "pop" in knee and fell.  Pain is described as severe.

## 2013-01-05 NOTE — Discharge Instructions (Signed)
Continue the Tramadol as needed for pain and take ibuprofen. Elevate the knee, apply ice and follow up with Dr. Herbert MoorsHudnal. Return as needed.

## 2013-01-05 NOTE — ED Provider Notes (Signed)
CSN: [id number]     Arrival date & time 01/05/13  1143 History   First MD Initiated Contact with Patient 01/05/13 1339     Chief Complaint  Patient presents with  . Knee Injury   (Consider location/radiation/quality/duration/timing/severity/associated sxs/prior Treatment) Patient is a 23 y.o. female presenting with knee pain. The history is provided by the patient.  Knee Pain Location:  Knee Time since incident:  2 days Injury: no   Pain details:    Quality:  Aching and sharp   Radiates to:  Does not radiate   Severity:  Moderate   Duration:  2 days   Timing:  Constant   Progression:  Worsening Chronicity:  Recurrent Dislocation: no   Foreign body present:  No foreign bodies Prior injury to area:  Yes Relieved by:  Nothing Worsened by:  Activity and abduction Associated symptoms: decreased ROM and swelling    Herma CarsonSierra B Schreck is a 23 y.o. female who presents to the ED with right knee pain that started a couple days ago. She went to Urgent Care yesterday and they told her she had fluid on her knee. She was walking last night and felt a pop in her knee and then had more severe pain. The pain at that time was severe. She is taking Tramadol for pain.  Herma CarsonSierra B Man is a 23 y.o. female who presents to the ED with right knee pain. The pain started about a month ago. She states that she played a lot of sports in high school and has swelling in her knee off and on. This time has lasted longer than usual and is more painful.  Past Medical History  Diagnosis Date  . Arthritis    Past Surgical History  Procedure Laterality Date  . Cholecystectomy    . Nasal fracture surgery     No family history on file. History  Substance Use Topics  . Smoking status: Heavy Tobacco Smoker -- 1.00 packs/day    Types: Cigarettes  . Smokeless tobacco: Not on file  . Alcohol Use: No   OB History   Grav Para Term Preterm Abortions TAB SAB Ect Mult Living                 Review of  Systems Negative except as stated in HPI  Allergies  Hydrocodone-acetaminophen; Propoxyphene n-acetaminophen; and Red dye  Home Medications   Current Outpatient Rx  Name  Route  Sig  Dispense  Refill  . clindamycin (CLEOCIN) 150 MG capsule   Oral   Take 1 capsule (150 mg total) by mouth 3 (three) times daily.   20 capsule   0   . EXPIRED: dicyclomine (BENTYL) 20 MG tablet   Oral   Take 1 tablet (20 mg total) by mouth 2 (two) times daily.   20 tablet   0   . oxyCODONE-acetaminophen (PERCOCET) 5-325 MG per tablet   Oral   Take 1-2 tablets by mouth every 6 (six) hours as needed for pain.   20 tablet   0   . traMADol (ULTRAM) 50 MG tablet   Oral   Take 1 tablet (50 mg total) by mouth every 6 (six) hours as needed.   30 tablet   0    BP 131/89  Pulse 88  Temp(Src) 98.7 F (37.1 C) (Oral)  Resp 16  Ht 5\' 5"  (1.651 m)  Wt 215 lb (97.523 kg)  BMI 35.78 kg/m2  SpO2 100%  LMP 12/18/2012 Physical Exam  Nursing note and  vitals reviewed. Constitutional: She is oriented to person, place, and time. She appears well-developed and well-nourished. No distress.  HENT:  Head: Normocephalic and atraumatic.  Eyes: EOM are normal.  Neck: Neck supple.  Cardiovascular: Normal rate.   Pulmonary/Chest: Effort normal.  Musculoskeletal:       Right knee: She exhibits decreased range of motion and swelling (minimal). She exhibits no ecchymosis, no erythema and normal alignment. Tenderness found. MCL and LCL tenderness noted.  Pedal pulses strong, adequate circulation, good touch sensation.   Neurological: She is alert and oriented to person, place, and time. No cranial nerve deficit.  Skin: Skin is warm and dry.  Psychiatric: She has a normal mood and affect. Her behavior is normal.    ED Course  Procedures (including critical care time) Labs Review Labs Reviewed - No data to display Imaging Review Dg Knee Complete 4 Views Right  01/05/2013   CLINICAL DATA:  Fall.  EXAM: RIGHT  KNEE - COMPLETE 4+ VIEW  COMPARISON:  None.  FINDINGS: There is no evidence of fracture, dislocation, or joint effusion. There is no evidence of arthropathy or other focal bone abnormality. Soft tissues are unremarkable.  IMPRESSION: Negative.   Electronically Signed   By: Elberta Fortis M.D.   On: 01/05/2013 12:23   23 y.o. female with right knee pain. Stable for discharge. She remains neurovascularly intact. Knee sleeve applied, crutches, ice, elevation and follow up with ortho.  Discussed with the patient and all questioned fully answered.    Medication List    STOP taking these medications       oxyCODONE-acetaminophen 5-325 MG per tablet  Commonly known as:  PERCOCET      TAKE these medications       traMADol 50 MG tablet  Commonly known as:  ULTRAM  Take 1 tablet (50 mg total) by mouth every 6 (six) hours as needed.      ASK your doctor about these medications       clindamycin 150 MG capsule  Commonly known as:  CLEOCIN  Take 1 capsule (150 mg total) by mouth 3 (three) times daily.     dicyclomine 20 MG tablet  Commonly known as:  BENTYL  Take 1 tablet (20 mg total) by mouth 2 (two) times daily.         Janne Napoleon, [place] 01/07/13 1705

## 2013-01-07 NOTE — ED Provider Notes (Signed)
Medical screening examination/treatment/procedure(s) were performed by non-physician practitioner and as supervising physician I was immediately available for consultation/collaboration.  EKG Interpretation   None         Dagmar HaitWilliam Dashea Mcmullan, MD 01/07/13 2328

## 2013-01-07 NOTE — ED Provider Notes (Signed)
Medical screening examination/treatment/procedure(s) were performed by resident physician or non-physician practitioner and as supervising physician I was immediately available for consultation/collaboration.   Petrina Melby DOUGLAS MD.   Khya Halls D Anyiah Coverdale, MD 01/07/13 0841 

## 2013-02-22 ENCOUNTER — Emergency Department (HOSPITAL_BASED_OUTPATIENT_CLINIC_OR_DEPARTMENT_OTHER)
Admission: EM | Admit: 2013-02-22 | Discharge: 2013-02-22 | Disposition: A | Discharge status: Home / Self Care | Payer: Medicaid Other | Attending: Emergency Medicine | Admitting: Emergency Medicine

## 2013-02-22 ENCOUNTER — Encounter (HOSPITAL_BASED_OUTPATIENT_CLINIC_OR_DEPARTMENT_OTHER): Payer: Self-pay | Admitting: Emergency Medicine

## 2013-02-22 ENCOUNTER — Emergency Department (HOSPITAL_BASED_OUTPATIENT_CLINIC_OR_DEPARTMENT_OTHER): Payer: Medicaid Other

## 2013-02-22 DIAGNOSIS — Y9289 Other specified places as the place of occurrence of the external cause: Secondary | ICD-10-CM | POA: Insufficient documentation

## 2013-02-22 DIAGNOSIS — S86919A Strain of unspecified muscle(s) and tendon(s) at lower leg level, unspecified leg, initial encounter: Secondary | ICD-10-CM

## 2013-02-22 DIAGNOSIS — M129 Arthropathy, unspecified: Secondary | ICD-10-CM | POA: Insufficient documentation

## 2013-02-22 DIAGNOSIS — Z79899 Other long term (current) drug therapy: Secondary | ICD-10-CM | POA: Insufficient documentation

## 2013-02-22 DIAGNOSIS — F17200 Nicotine dependence, unspecified, uncomplicated: Secondary | ICD-10-CM | POA: Insufficient documentation

## 2013-02-22 DIAGNOSIS — W010XXA Fall on same level from slipping, tripping and stumbling without subsequent striking against object, initial encounter: Secondary | ICD-10-CM | POA: Insufficient documentation

## 2013-02-22 DIAGNOSIS — IMO0002 Reserved for concepts with insufficient information to code with codable children: Secondary | ICD-10-CM | POA: Insufficient documentation

## 2013-02-22 DIAGNOSIS — Y9389 Activity, other specified: Secondary | ICD-10-CM | POA: Insufficient documentation

## 2013-02-22 MED ORDER — NAPROXEN 375 MG PO TABS: 375.0000 mg | ORAL_TABLET | Freq: Two times a day (BID) | ORAL | Status: DC

## 2013-02-22 MED ORDER — TRAMADOL HCL 50 MG PO TABS: 50.0000 mg | ORAL_TABLET | Freq: Four times a day (QID) | ORAL | Status: DC | PRN

## 2013-02-22 NOTE — Discharge Instructions (Signed)
Knee Sprain  A knee sprain is a tear in one of the strong, fibrous tissues that connect the bones (ligaments) in your knee. The severity of the sprain depends on how much of the ligament is torn. The tear can be either partial or complete.  CAUSES   Often, sprains are a result of a fall or injury. The force of the impact causes the fibers of your ligament to stretch too much. This excess tension causes the fibers of your ligament to tear.  SIGNS AND SYMPTOMS   You may have some loss of motion in your knee. Other symptoms include:   Bruising.   Pain in the knee area.   Tenderness of the knee to the touch.   Swelling.  DIAGNOSIS   To diagnose a knee sprain, your health care provider will physically examine your knee. Your health care provider may also suggest an X-ray exam of your knee to make sure no bones are broken.  TREATMENT   If your ligament is only partially torn, treatment usually involves keeping the knee in a fixed position (immobilization) or bracing your knee for activities that require movement for several weeks. To do this, your health care provider will apply a bandage, cast, or splint to keep your knee from moving and to support your knee during movement until it heals. For a partially torn ligament, the healing process usually takes 4 6 weeks.  If your ligament is completely torn, depending on which ligament it is, you may need surgery to reconnect the ligament to the bone or reconstruct it. After surgery, a cast or splint may be applied and will need to stay on your knee for 4 6 weeks while your ligament heals.  HOME CARE INSTRUCTIONS   Keep your injured knee elevated to decrease swelling.   To ease pain and swelling, apply ice to the injured area:   Put ice in a plastic bag.   Place a towel between your skin and the bag.   Leave the ice on for 20 minutes, 2 3 times a day.   Only take medicine for pain as directed by your health care provider.   Do not leave your knee unprotected until  pain and stiffness go away (usually 4 6 weeks).   If you have a cast or splint, do not allow it to get wet. If you have been instructed not to remove it, cover it with a plastic bag when you shower or bathe. Do not swim.   Your health care provider may suggest exercises for you to do during your recovery to prevent or limit permanent weakness and stiffness.  SEEK IMMEDIATE MEDICAL CARE IF:   Your cast or splint becomes damaged.   Your pain becomes worse.   You have significant pain, swelling, or numbness below the cast or splint.  MAKE SURE YOU:   Understand these instructions.   Will watch your condition.   Will get help right away if you are not doing well or get worse.  Document Released: 12/19/2004 Document Revised: 10/09/2012 Document Reviewed: 07/31/2012  [employer] Patient Information 2014 [employer].

## 2013-02-22 NOTE — ED Notes (Signed)
Fell on ice today-c/o right knee pain.  Denies injury elsewhere.  Pre-existing right knee injury.

## 2013-02-22 NOTE — ED Provider Notes (Signed)
CSN: [id number]     Arrival date & time 02/22/13  1557 History  This chart was scribed for Rolan Bucco, MD by Dorothey Baseman, ED Scribe. This patient was seen in room MH04/MH04 and the patient's care was started at 5:18 PM.    Chief Complaint  Patient presents with  . Knee Pain   The history is provided by the patient. No language interpreter was used.   HPI Comments: Natalie Russell is a 23 y.o. Female with a history of right knee injury/pain and arthritis who presents to the Emergency Department complaining of a constant pain to the right knee that is chronic in nature, but was exacerbated after slipping on ice earlier today, causing her to fall and land on her right knee. She denies any other injuries secondary to the fall. Patient states that she is followed by [employer], but that she does not want to have surgery on the knee. She denies numbness. Patient has an allergy to hydrocodone-acetaminophen. Patient has no other pertinent medical history.   Past Medical History  Diagnosis Date  . Arthritis    Past Surgical History  Procedure Laterality Date  . Cholecystectomy    . Nasal fracture surgery     No family history on file. History  Substance Use Topics  . Smoking status: Heavy Tobacco Smoker -- 1.00 packs/day    Types: Cigarettes  . Smokeless tobacco: Not on file  . Alcohol Use: No   OB History   Grav Para Term Preterm Abortions TAB SAB Ect Mult Living                 Review of Systems  Constitutional: Negative for fever.  Gastrointestinal: Negative for nausea and vomiting.  Musculoskeletal: Positive for arthralgias (right knee). Negative for back pain and neck pain.  Skin: Negative for wound.  Neurological: Negative for weakness, numbness and headaches.   Allergies  Hydrocodone-acetaminophen; Propoxyphene n-acetaminophen; and Red dye  Home Medications   Current Outpatient Rx  Name  Route  Sig  Dispense  Refill  . clindamycin (CLEOCIN) 150 MG  capsule   Oral   Take 1 capsule (150 mg total) by mouth 3 (three) times daily.   20 capsule   0   . EXPIRED: dicyclomine (BENTYL) 20 MG tablet   Oral   Take 1 tablet (20 mg total) by mouth 2 (two) times daily.   20 tablet   0   . naproxen (NAPROSYN) 375 MG tablet   Oral   Take 1 tablet (375 mg total) by mouth 2 (two) times daily.   20 tablet   0   . traMADol (ULTRAM) 50 MG tablet   Oral   Take 1 tablet (50 mg total) by mouth every 6 (six) hours as needed.   30 tablet   0   . traMADol (ULTRAM) 50 MG tablet   Oral   Take 1 tablet (50 mg total) by mouth every 6 (six) hours as needed.   15 tablet   0    Triage Vitals: BP 122/86  Pulse 91  Temp(Src) 97.2 F (36.2 C) (Oral)  Resp 16  Ht 5\' 5"  (1.651 m)  Wt 256 lb (116.121 kg)  BMI 42.60 kg/m2  SpO2 98%  LMP 02/16/2013  Physical Exam  Constitutional: She is oriented to person, place, and time. She appears well-developed and well-nourished.  HENT:  Head: Normocephalic and atraumatic.  Eyes: Pupils are equal, round, and reactive to light.  Neck: Normal range of motion. Neck  supple.  Cardiovascular: Intact distal pulses.   Pulses:      Dorsalis pedis pulses are 2+ on the right side, and 2+ on the left side.  Pulmonary/Chest: Effort normal. No respiratory distress.  Abdominal: Soft.  Musculoskeletal: Normal range of motion. She exhibits no edema.       Right knee: She exhibits effusion and bony tenderness. She exhibits no deformity, no laceration and normal patellar mobility. Tenderness found. Medial joint line and lateral joint line tenderness noted.  Normal sensation and motor function. Tenderness diffusely along the anterior portion of the right patella and medial and lateral joint lines. Small effusion is present. Ligaments appear grossly intact to Lachman maneuver, valgus and varus stress. Quadriceps and patellar tendons appear intact. No pain to the hip or ankle. No overlying wounds noted.   Lymphadenopathy:    She  has no cervical adenopathy.  Neurological: She is alert and oriented to person, place, and time.  Skin: Skin is warm and dry. No rash noted.  Psychiatric: She has a normal mood and affect.    ED Course  Procedures (including critical care time)  DIAGNOSTIC STUDIES: Oxygen Saturation is 98% on room air, normal by my interpretation.    COORDINATION OF CARE: 5:19 PM- Discussed that x-ray results were negative. Will discharge patient with a knee brace and pain medication to manage symptoms. Advised patient to follow RICE procedures at home. Advised her to follow up with her orthopedist. Discussed treatment plan with patient at bedside and patient verbalized agreement.     Labs Review Labs Reviewed - No data to display  Imaging Review Dg Knee Complete 4 Views Right  02/22/2013   CLINICAL DATA:  Fall.  EXAM: RIGHT KNEE - COMPLETE 4+ VIEW  COMPARISON:  None.  FINDINGS: There is no evidence of fracture, dislocation, or joint effusion. There is no evidence of arthropathy or other focal bone abnormality. Soft tissues are unremarkable.  IMPRESSION: Negative.   Electronically Signed   By: Signa Kellaylor  Stroud M.D.   On: 02/22/2013 16:33    EKG Interpretation   None       MDM   Final diagnoses:  Knee strain    No fracture identified.  Will f/u with ortho  I personally performed the services described in this documentation, which was scribed in my presence.  The recorded information has been reviewed and considered.     Rolan BuccoMelanie Sereena Marando, MD 02/22/13 [phone]

## 2013-11-05 ENCOUNTER — Emergency Department: Payer: Self-pay | Admitting: Emergency Medicine

## 2013-11-05 LAB — COMPREHENSIVE METABOLIC PANEL
ALK PHOS: 81 U/L
ALT: 28 U/L
ANION GAP: 8 (ref 7–16)
Albumin: 3.2 g/dL — ABNORMAL LOW (ref 3.4–5.0)
BILIRUBIN TOTAL: 0.4 mg/dL (ref 0.2–1.0)
BUN: 4 mg/dL — ABNORMAL LOW (ref 7–18)
CREATININE: 0.67 mg/dL (ref 0.60–1.30)
Calcium, Total: 8.4 mg/dL — ABNORMAL LOW (ref 8.5–10.1)
Chloride: 104 mmol/L (ref 98–107)
Co2: 23 mmol/L (ref 21–32)
EGFR (African American): >60
EGFR (Non-African Amer.): >60
Glucose: 101 mg/dL — ABNORMAL HIGH (ref 65–99)
Osmolality: 267 (ref 275–301)
Potassium: 3.7 mmol/L (ref 3.5–5.1)
SGOT(AST): 23 U/L (ref 15–37)
SODIUM: 135 mmol/L — AB (ref 136–145)
Total Protein: 7.1 g/dL (ref 6.4–8.2)

## 2013-11-05 LAB — URINALYSIS, COMPLETE
Bacteria: NONE SEEN
Bilirubin,UR: NEGATIVE
Glucose,UR: NEGATIVE mg/dL (ref 0–75)
Ketone: NEGATIVE
NITRITE: NEGATIVE
Ph: 5 (ref 4.5–8.0)
Protein: 30
RBC,UR: 5 /HPF (ref 0–5)
SPECIFIC GRAVITY: 1.025 (ref 1.003–1.030)
Squamous Epithelial: 46
WBC UR: 15 /HPF (ref 0–5)

## 2013-11-05 LAB — CBC
HCT: 48 % — AB (ref 35.0–47.0)
HGB: 15.8 g/dL (ref 12.0–16.0)
MCH: 32.5 pg (ref 26.0–34.0)
MCHC: 32.9 g/dL (ref 32.0–36.0)
MCV: 99 fL (ref 80–100)
Platelet: 272 10*3/uL (ref 150–440)
RBC: 4.87 10*6/uL (ref 3.80–5.20)
RDW: 14 % (ref 11.5–14.5)
WBC: 12.4 10*3/uL — ABNORMAL HIGH (ref 3.6–11.0)

## 2013-11-05 LAB — HCG, QUANTITATIVE, PREGNANCY: Beta Hcg, Quant.: 11824 m[IU]/mL — ABNORMAL HIGH

## 2013-11-30 ENCOUNTER — Emergency Department: Payer: Self-pay | Admitting: Internal Medicine

## 2013-11-30 LAB — COMPREHENSIVE METABOLIC PANEL
ALT: 43 U/L
ANION GAP: 7 (ref 7–16)
Albumin: 3.2 g/dL — ABNORMAL LOW (ref 3.4–5.0)
Alkaline Phosphatase: 99 U/L
BUN: 5 mg/dL — AB (ref 7–18)
Bilirubin,Total: 0.3 mg/dL (ref 0.2–1.0)
CALCIUM: 8.7 mg/dL (ref 8.5–10.1)
CREATININE: 0.7 mg/dL (ref 0.60–1.30)
Chloride: 107 mmol/L (ref 98–107)
Co2: 22 mmol/L (ref 21–32)
EGFR (Non-African Amer.): >60
GLUCOSE: 87 mg/dL (ref 65–99)
OSMOLALITY: 269 (ref 275–301)
Potassium: 3.8 mmol/L (ref 3.5–5.1)
SGOT(AST): 31 U/L (ref 15–37)
SODIUM: 136 mmol/L (ref 136–145)
Total Protein: 6.9 g/dL (ref 6.4–8.2)

## 2013-11-30 LAB — DRUG SCREEN, URINE
Amphetamines, Ur Screen: NEGATIVE (ref ?–1000)
BENZODIAZEPINE, UR SCRN: NEGATIVE (ref ?–200)
Barbiturates, Ur Screen: NEGATIVE (ref ?–200)
COCAINE METABOLITE, UR ~~LOC~~: NEGATIVE (ref ?–300)
Cannabinoid 50 Ng, Ur ~~LOC~~: POSITIVE (ref ?–50)
MDMA (Ecstasy)Ur Screen: NEGATIVE (ref ?–500)
Methadone, Ur Screen: NEGATIVE (ref ?–300)
OPIATE, UR SCREEN: NEGATIVE (ref ?–300)
Phencyclidine (PCP) Ur S: NEGATIVE (ref ?–25)
Tricyclic, Ur Screen: NEGATIVE (ref ?–1000)

## 2013-11-30 LAB — CBC
HCT: 45.5 % (ref 35.0–47.0)
HGB: 15.1 g/dL (ref 12.0–16.0)
MCH: 32.2 pg (ref 26.0–34.0)
MCHC: 33.1 g/dL (ref 32.0–36.0)
MCV: 97 fL (ref 80–100)
PLATELETS: 247 10*3/uL (ref 150–440)
RBC: 4.67 10*6/uL (ref 3.80–5.20)
RDW: 13.1 % (ref 11.5–14.5)
WBC: 10.7 10*3/uL (ref 3.6–11.0)

## 2013-11-30 LAB — HCG, QUANTITATIVE, PREGNANCY: Beta Hcg, Quant.: 20210 m[IU]/mL — ABNORMAL HIGH

## 2013-11-30 LAB — URINALYSIS, COMPLETE
BILIRUBIN, UR: NEGATIVE
Blood: NEGATIVE
Glucose,UR: NEGATIVE mg/dL (ref 0–75)
Ketone: NEGATIVE
Nitrite: NEGATIVE
PH: 8 (ref 4.5–8.0)
PROTEIN: NEGATIVE
RBC,UR: NONE SEEN /HPF (ref 0–5)
SPECIFIC GRAVITY: 1.005 (ref 1.003–1.030)

## 2013-11-30 LAB — TROPONIN I: Troponin-I: <0.02 ng/mL

## 2014-02-23 ENCOUNTER — Observation Stay: Payer: Self-pay

## 2014-03-14 ENCOUNTER — Observation Stay: Payer: Self-pay | Admitting: Obstetrics & Gynecology

## 2014-04-14 ENCOUNTER — Observation Stay
Admit: 2014-04-14 | Disposition: A | Discharge status: Home / Self Care | Payer: Self-pay | Attending: Certified Nurse Midwife | Admitting: Certified Nurse Midwife

## 2014-04-14 LAB — FETAL FIBRONECTIN
Appearance: NORMAL
Fetal Fibronectin: NEGATIVE

## 2014-04-14 LAB — URINALYSIS, COMPLETE
BLOOD: NEGATIVE
Bilirubin,UR: NEGATIVE
Glucose,UR: NEGATIVE mg/dL (ref 0–75)
KETONE: NEGATIVE
Nitrite: NEGATIVE
PH: 7 (ref 4.5–8.0)
PROTEIN: NEGATIVE
Specific Gravity: 1.012 (ref 1.003–1.030)

## 2014-04-23 ENCOUNTER — Emergency Department
Admit: 2014-04-23 | Disposition: A | Discharge status: Home / Self Care | Payer: Self-pay | Admitting: Emergency Medicine

## 2014-04-23 LAB — URINALYSIS, COMPLETE
Bilirubin,UR: NEGATIVE
Blood: NEGATIVE
Glucose,UR: NEGATIVE mg/dL (ref 0–75)
Ketone: NEGATIVE
NITRITE: NEGATIVE
PROTEIN: NEGATIVE
Ph: 7 (ref 4.5–8.0)
Specific Gravity: 1.014 (ref 1.003–1.030)

## 2014-04-23 LAB — BASIC METABOLIC PANEL
Anion Gap: 8 (ref 7–16)
BUN: <5 mg/dL
CALCIUM: 8.9 mg/dL
CO2: 20 mmol/L — AB
Chloride: 108 mmol/L
Creatinine: 0.44 mg/dL
EGFR (African American): >60
EGFR (Non-African Amer.): >60
Glucose: 118 mg/dL — ABNORMAL HIGH
POTASSIUM: 3.4 mmol/L — AB
SODIUM: 136 mmol/L

## 2014-04-23 LAB — CBC
HCT: 35.9 % (ref 35.0–47.0)
HGB: 12.1 g/dL (ref 12.0–16.0)
MCH: 31.6 pg (ref 26.0–34.0)
MCHC: 33.7 g/dL (ref 32.0–36.0)
MCV: 94 fL (ref 80–100)
PLATELETS: 310 10*3/uL (ref 150–440)
RBC: 3.82 10*6/uL (ref 3.80–5.20)
RDW: 12 % (ref 11.5–14.5)
WBC: 17.1 10*3/uL — AB (ref 3.6–11.0)

## 2014-04-28 ENCOUNTER — Observation Stay
Admit: 2014-04-28 | Disposition: A | Discharge status: Home / Self Care | Payer: Self-pay | Attending: Obstetrics & Gynecology | Admitting: Obstetrics & Gynecology

## 2014-05-12 ENCOUNTER — Encounter: Payer: Self-pay | Admitting: *Deleted

## 2014-05-12 ENCOUNTER — Observation Stay
Admission: EM | Admit: 2014-05-12 | Discharge: 2014-05-12 | Disposition: A | Discharge status: Home / Self Care | Payer: Medicaid Other | Attending: Obstetrics and Gynecology | Admitting: Obstetrics and Gynecology

## 2014-05-12 DIAGNOSIS — Z3A32 32 weeks gestation of pregnancy: Secondary | ICD-10-CM | POA: Insufficient documentation

## 2014-05-12 DIAGNOSIS — O26893 Other specified pregnancy related conditions, third trimester: Secondary | ICD-10-CM | POA: Diagnosis not present

## 2014-05-12 DIAGNOSIS — O26899 Other specified pregnancy related conditions, unspecified trimester: Secondary | ICD-10-CM

## 2014-05-12 DIAGNOSIS — O99333 Smoking (tobacco) complicating pregnancy, third trimester: Secondary | ICD-10-CM | POA: Diagnosis not present

## 2014-05-12 DIAGNOSIS — Z3490 Encounter for supervision of normal pregnancy, unspecified, unspecified trimester: Secondary | ICD-10-CM

## 2014-05-12 DIAGNOSIS — R109 Unspecified abdominal pain: Secondary | ICD-10-CM

## 2014-05-12 LAB — URINALYSIS COMPLETE WITH MICROSCOPIC (ARMC ONLY)
BILIRUBIN URINE: NEGATIVE
Glucose, UA: NEGATIVE mg/dL
Hgb urine dipstick: NEGATIVE
Nitrite: NEGATIVE
Protein, ur: NEGATIVE mg/dL
Specific Gravity, Urine: 1.008 (ref 1.005–1.030)
pH: 7 (ref 5.0–8.0)

## 2014-05-12 LAB — CHLAMYDIA/NGC RT PCR (ARMC ONLY)
Chlamydia Tr: NOT DETECTED
N gonorrhoeae: NOT DETECTED

## 2014-05-12 LAB — POCT NITRAZINE TEST

## 2014-05-12 NOTE — Discharge Summary (Signed)
OB Triage DC Summary  24 y/o G3P2 @ 32/6 presenting for EOL and SROM evaluation.  Fetus rNST with ?variable but AFI normal, cephalic with quiet toco on EFM.  Patient cl/long/hi and negative speculum and dip eval for ROM. UCx sent with ?u/a and gc/ct sent. Will wait on abx for ucx results. RTC 5/11

## 2014-05-12 NOTE — Progress Notes (Signed)
OB Triage Note Results for Herma CarsonSTEWART, Adell B (MRN [id number]) as of 05/12/2014 19:47  Ref. Range 05/12/2014 18:15  Appearance Latest Ref Range: CLEAR  CLEAR (A)  Bacteria, UA Latest Ref Range: NONE SEEN  MANY (A)  Bilirubin Urine Latest Ref Range: NEGATIVE  NEGATIVE  Color, Urine Latest Ref Range: YELLOW  STRAW (A)  Glucose Latest Ref Range: NEGATIVE mg/dL NEGATIVE  Hgb urine dipstick Latest Ref Range: NEGATIVE  NEGATIVE  Ketones, ur Latest Ref Range: NEGATIVE mg/dL TRACE (A)  Leukocytes, UA Latest Ref Range: NEGATIVE  3+ (A)  Nitrite Latest Ref Range: NEGATIVE  NEGATIVE  pH Latest Ref Range: 5.0-8.0  7.0  Protein Latest Ref Range: NEGATIVE mg/dL NEGATIVE  RBC / HPF Latest Ref Range: 0-5 RBC/hpf 0-5  Specific Gravity, Urine Latest Ref Range: 1.005-1.030  1.008  Squamous Epithelial / LPF Latest Ref Range: NONE SEEN  0-5 (A)  WBC, UA Latest Ref Range: 0-5 WBC/hpf 0-5   U/a equivocal given negative s/s. Will send UCx and treat if culture is +. Okay for d/c to home  Abeytas Bingharlie Zelma Snead, Montez HagemanJr MD [facility]  Pager: [phone]

## 2014-05-12 NOTE — H&P (Addendum)
Obstetrics Admission History & Physical  Primary OBGYN: [facility]   Chief Complaint: UCs, LOF and diarrhea  History of Present Illness  24 y.o. N5A2130G3P1012 @ 4349w6d . Pregnancy complicated by: BMI 42, tobacco abuse, h/o GC/CT with negative TOC.  Natalie Russell presents for above CC. Patient states that has been having either q8-1073m or q5250m UCs since Sunday and LOF since yesterday. Pt states unsure of pad use (?two non saturated) and unsure of characteristics. No VB, dysuria, fevers, chills or decreased FM. Three loose stools today but eating fine. Ate [employer] earlier this afternoon. Patient is somewhat of a poor historian  Review of Systems: her 12 point review of systems is negative or as noted in the History of Present Illness.   PMHx:  Past Medical History  Diagnosis Date  . Arthritis    PSHx:  Past Surgical History  Procedure Laterality Date  . Cholecystectomy    . Nasal fracture surgery    . Cholecystectomy N/A    Medications: none  Allergies: is allergic to propoxyphene n-acetaminophen; red dye; and hydrocodone-acetaminophen. OBHx:  OB History  Gravida Para Term Preterm AB SAB TAB Ectopic Multiple Living  3 1 1 0 1 1 0 0 0 2    # Outcome Date GA Lbr Len/2nd Weight Sex Delivery Anes PTL Lv  3 Current           2 Term 10/31/06 [redacted]w[redacted]d  6 lb 12 oz (3.062 kg) M Vag-Spont EPI Y Y     Complications: Hypertension affecting pregnancy  1 SAB                        FHx:  Family History  Problem Relation Age of Onset  . Diabetes Paternal Grandmother   . Heart disease Paternal Grandmother   . Hypertension Paternal Grandmother    Soc Hx:  History   Social History  . Marital Status: Single    Spouse Name: N/A  . Number of Children: N/A  . Years of Education: N/A   Occupational History  . Not on file.   Social History Main Topics  . Smoking status: Light Tobacco Smoker -- 0.25 packs/day    Types: Cigarettes  . Smokeless tobacco: Not on file  . Alcohol Use:  No  . Drug Use: No  . Sexual Activity: Yes   Other Topics Concern  . Not on file   Social History Narrative     Objective    Current Vital Signs 24h Vital Sign Ranges  T 98.2 F (36.8 C) Temp  Avg: 98.2 F (36.8 C)  Min: 98.2 F (36.8 C)  Max: 98.2 F (36.8 C)  BP 123/75 mmHg BP  Min: 123/75  Max: 123/75  HR (!) 101 Pulse  Avg: 101  Min: 101  Max: 101  RR 16 Resp  Avg: 16  Min: 16  Max: 16  SaO2     No Data Recorded       24  Hour I/O Current Shift I/O  Time Ins Outs       EFM: 145 baseline, +accels, ?two slight variables, mod variability  Toco: quiet  General: Well nourished, well developed female in no acute distress.  Skin:  Warm and dry.  Respiratory:  Normal respiratory effort Abdomen: soft, obese, nttp, non distended Neuro/Psych:  Normal mood and affect.   SSE: visually closed cervix, no pooling, negative cough test SVE: cl (1cm externally but closed internally)/long/hight  Labs: u/a pending  Radiology BSUS cephalic, AFI 19.9, +FM, FHR 145  Assessment & Plan   24 y.o. O5D6644G3P1012 @ 5054w6d with signs and symptoms, with likely B-H UCs *IUP: rNST with reassuring u/s *EOL: negative *SROM: negative. GC/CT swab sent. Wet prep negative, nitrazine and fern negative *GI: follow up with patient tomorrow at appointment regarding s/s *Tobacco abuse: encouraged cessation *Dispo: d/c to home if negative u/a   Natalie Russell, Jr. MD [facility] Pager [phone]

## 2014-05-12 NOTE — Discharge Instructions (Signed)
Keep your appointment for tomorrow, May 11th.

## 2014-05-12 NOTE — H&P (Signed)
L&D Evaluation:  History:  HPI Pt is a 24 yo G3P1011 at 3780w6d who presents to L&D from the office after telling the provider that she was having "severe abdominal pain." She reports that she has pain near her umbilicus and her right middle/lower abdomen that is like a period cramp but then after further questioning, she reports some sharp shooting pain as well x1 day. She reports +FM, denies vb or lof. She reports some dizziness with position changes- going from sitting to standing and hot flashes. A FFN was collected in the office. Her pregnancy history is significant for obesity with a BMI over 40, +CT/GC, and elevated BP prior to 20 weeks. She is A+, VI, RI.   Presents with abdominal pain   Patient's Medical History hidradenitis suppurativa   Patient's Surgical History Colecystectomy  nasal surgery   Medications Pre Natal Vitamins   Allergies other, benedryl, darvocet, vicodin   Social History tobacco   Family History Non-Contributory   ROS:  ROS All systems were reviewed.  HEENT, CNS, GI, GU, Respiratory, CV, Renal and Musculoskeletal systems were found to be normal.   Exam:  Vital Signs stable   General no apparent distress   Mental Status clear   Chest clear   Heart normal sinus rhythm   Abdomen gravid, non-tender   Pelvic no external lesions   Mebranes Intact   FHT 155, moderate variability, +accels, no decels   Ucx absent   Skin dry, no lesions, no rashes   Lymph no lymphadenopathy   Other UA negative- see labs FFN negative   Impression:  Impression reactive NST, IUP at 6080w6d probable round ligament pain   Plan:  Plan discharge   Follow Up Appointment need to schedule   Electronic Signatures: Jannet MantisSubudhi, Camay Pedigo (CNM)  (Signed 12-Apr-16 14:03)  Authored: L&D Evaluation   Last Updated: 12-Apr-16 14:03 by Jannet MantisSubudhi, Rei Contee (CNM)

## 2014-05-12 NOTE — H&P (Signed)
L&D Evaluation:  History:  HPI 24 year old G3 P1011 with EDC=-07/01/2014 by a 6 week ultrasound presents at 2121 weeks 5 days with c/o sharp pain in right lower quadrant since 0730 this Am when she sat down on the couch. She took Tylenol 650 mgm at 0930 with no relief of pain. She rates the pain at a 9/10. She first felt this pain after she fell on her right side getting out of her car 4 days ago. Aggravating factors: coughing, moving. Lying on her left side alleviates her pain. Denies fever, dysuria, vaginitis sx, hematuria, VB.  Tends toward constipation-last BM 1-2 days ago. Has had some nausea today, last vomited a couple of days ago. Could not remember when she felt baby last. [facility] for obesity (BMI=42), smoking, +GC and + Chlamydia (x3), and a hx of preeclampsia with her first baby. Has had some elevated BPs this pregnancy before 20 weeks (?CHTN)   Presents with abdominal pain   Patient's Medical History obesity, Preeclampsia with first pregnancy, +GC/Chlamydia   Patient's Surgical History Lap chole, septorhinoplasty   Medications Pre Natal Vitamins  Tylenol (Acetaminophen)   Allergies Benadryl (hives).   Social History tobacco   Family History Non-Contributory   ROS:  ROS see HPI   Exam:  Vital Signs 131/75   Urine Protein pending   General moaning with pain when turning to back   Mental Status clear   Chest clear   Heart normal sinus rhythm, no murmur/gallop/rubs   Abdomen obese/gravid. Tenderness over right uterine border. Displacing uterus recreates pain.   Fetal Position cephalic on US   FHT FHT WNL   Skin some healing boils on abdomen   Impression:  Impression IUP at 21 5/7 weeks with probable right round ligament pain.   Plan:  Plan UA, CBC. Will give 2 Norco x1 and can apply cold/heat to round ligament area.   Electronic Signatures: Trinna BalloonGutierrez, Jerman Tinnon L (CNM)  (Signed 22-Feb-16 15:49)  Authored: L&D Evaluation   Last Updated:  22-Feb-16 15:49 by Trinna BalloonGutierrez, Zerrick Hanssen L (CNM)

## 2014-05-12 NOTE — H&P (Signed)
L&D Evaluation:  History Expanded:  HPI Worsening cough. Inhaler last week. Now dx w Bronchitis (ER doc).  Cough keeps her awake, fatigued. [redacted] weeks EGA   Patient's Medical History Obesity   Patient's Surgical History Colecystectomy   Medications Pre Natal Vitamins   Allergies Benadryl   Social History none   Family History Non-Contributory   ROS:  ROS All systems were reviewed.  HEENT, CNS, GI, GU, Respiratory, CV, Renal and Musculoskeletal systems were found to be normal.   Exam:  Vital Signs stable   General no apparent distress   Mental Status clear   Abdomen gravid, non-tender   FHT normal rate with no decels   Ucx absent   Other A NST procedure was performed with FHR monitoring and a normal baseline established, appropriate time of 20-40 minutes of evaluation, and accels >2 seen w 15x15 characteristics.  Results show a REACTIVE Non-Stress Test.   Impression:  Impression Bronchitis, Cough, Pregnant third trimester   Plan:  Plan ABX, Tussionex, Rest, follow-up for Prenatal Care   Follow Up Appointment already scheduled. in 1 week   Electronic Signatures: Letitia LibraHarris, Persis Graffius Paul (MD)  (Signed 26-Apr-16 16:25)  Authored: L&D Evaluation   Last Updated: 26-Apr-16 16:25 by Letitia LibraHarris, Meta Kroenke Paul (MD)

## 2014-05-12 NOTE — OB Triage Note (Signed)
Started having contractions regularly (8 minutes apart) this AM with diarrhea and leaking fluid.

## 2014-05-12 NOTE — H&P (Signed)
L&D Evaluation:  History:  HPI 24 year old G3 P1011 with EDC=-07/01/2014 by a 6 week ultrasound presents at 24.3 weeks with c/o falling down and hitting the right side of her abdomen. She called the on-call provider at 7am when the fall happened but did not show up to L&D until 10:15. She reported at 7am that the baby was not moving much, however she says that she waited so long to come to the hospital because she wanted to "make sure the baby was moving" before she came.  She was seen on 02/23/14 with reports of right sided pain as well anf at that time had reported falling out of her car on her abdomen 4 days prior.  Aggravating factors: coughing, moving. Lying on her left side alleviates her pain.  [facility] for obesity (BMI=42), smoking, +GC and + Chlamydia (x3), and a hx of preeclampsia with her first baby. Has had some elevated BPs this pregnancy before 20 weeks (?CHTN)   Presents with abdominal pain, fall at 7am   Patient's Medical History obesity, Preeclampsia with first pregnancy, +GC/Chlamydia   Patient's Surgical History Lap chole, septorhinoplasty   Medications Pre Natal Vitamins  Tylenol (Acetaminophen)   Allergies Benadryl (hives).   Social History tobacco   Family History Non-Contributory   ROS:  ROS see HPI   Exam:  Vital Signs 131/75   Urine Protein pending   General no apparent distress, sitting up in bed playing on her phone   Mental Status clear   Chest clear   Heart normal sinus rhythm, no murmur/gallop/rubs   Abdomen obese/gravid. Tenderness over right side   FHT FHT WNL- 140-150s   Ucx absent   Skin dry   Lymph no lymphadenopathy   Other pt asking for work note since she missed work today. Pt asking about pain medications besides tylenol for pain.   Impression:  Impression IUP at 24 3/7 weeks with probable right round ligament pain.   Plan:  Plan discharge, discussed comfort measures with patient such as tylenol, heat and ice.  Work note given with no restrictions. Discussed s/sx of abruption with pt.   Comments Addendum: pt discharged in stable condition at 11:40.   Follow Up Appointment already scheduled. in 4 weeks   Electronic Signatures: Jannet MantisSubudhi, Kei Langhorst (CNM)  (Signed 12-Mar-16 12:01)  Authored: L&D Evaluation   Last Updated: 12-Mar-16 12:01 by Jannet MantisSubudhi, Quinterrius Errington (CNM)

## 2014-05-14 ENCOUNTER — Encounter: Payer: Self-pay | Admitting: Emergency Medicine

## 2014-05-14 ENCOUNTER — Emergency Department
Admission: EM | Admit: 2014-05-14 | Discharge: 2014-05-14 | Disposition: A | Discharge status: Home / Self Care | Payer: Medicaid Other | Attending: Emergency Medicine | Admitting: Emergency Medicine

## 2014-05-14 DIAGNOSIS — Z79899 Other long term (current) drug therapy: Secondary | ICD-10-CM | POA: Insufficient documentation

## 2014-05-14 DIAGNOSIS — F17210 Nicotine dependence, cigarettes, uncomplicated: Secondary | ICD-10-CM | POA: Insufficient documentation

## 2014-05-14 DIAGNOSIS — J209 Acute bronchitis, unspecified: Secondary | ICD-10-CM

## 2014-05-14 DIAGNOSIS — J42 Unspecified chronic bronchitis: Secondary | ICD-10-CM | POA: Diagnosis not present

## 2014-05-14 DIAGNOSIS — Z3A33 33 weeks gestation of pregnancy: Secondary | ICD-10-CM | POA: Diagnosis not present

## 2014-05-14 DIAGNOSIS — O99513 Diseases of the respiratory system complicating pregnancy, third trimester: Secondary | ICD-10-CM | POA: Insufficient documentation

## 2014-05-14 DIAGNOSIS — O99333 Smoking (tobacco) complicating pregnancy, third trimester: Secondary | ICD-10-CM | POA: Diagnosis not present

## 2014-05-14 MED ORDER — ALBUTEROL SULFATE (2.5 MG/3ML) 0.083% IN NEBU
INHALATION_SOLUTION | RESPIRATORY_TRACT | Status: AC
  Administered 2014-05-14: 2.5 mg via RESPIRATORY_TRACT
  Filled 2014-05-14: qty 3

## 2014-05-14 MED ORDER — AMOXICILLIN-POT CLAVULANATE 875-125 MG PO TABS
ORAL_TABLET | ORAL | Status: AC
  Administered 2014-05-14: 1 via ORAL
  Filled 2014-05-14: qty 1

## 2014-05-14 MED ORDER — ALBUTEROL SULFATE (2.5 MG/3ML) 0.083% IN NEBU
2.5000 mg | INHALATION_SOLUTION | Freq: Once | RESPIRATORY_TRACT | Status: AC
  Administered 2014-05-14: 2.5 mg via RESPIRATORY_TRACT

## 2014-05-14 MED ORDER — ACETAMINOPHEN 325 MG PO TABS
ORAL_TABLET | ORAL | Status: AC
  Administered 2014-05-14: 650 mg via ORAL
  Filled 2014-05-14: qty 2

## 2014-05-14 MED ORDER — AMOXICILLIN-POT CLAVULANATE 875-125 MG PO TABS
1.0000 | ORAL_TABLET | Freq: Once | ORAL | Status: AC
  Administered 2014-05-14: 1 via ORAL

## 2014-05-14 MED ORDER — ACETAMINOPHEN 325 MG PO TABS
650.0000 mg | ORAL_TABLET | Freq: Once | ORAL | Status: AC
  Administered 2014-05-14: 650 mg via ORAL

## 2014-05-14 MED ORDER — AMOXICILLIN-POT CLAVULANATE 875-125 MG PO TABS
1.0000 | ORAL_TABLET | Freq: Two times a day (BID) | ORAL | Status: DC
Start: 2014-05-14 — End: 2019-05-04

## 2014-05-14 MED ORDER — ALBUTEROL SULFATE HFA 108 (90 BASE) MCG/ACT IN AERS: 2.0000 | INHALATION_SPRAY | RESPIRATORY_TRACT | Status: AC | PRN

## 2014-05-14 NOTE — Discharge Instructions (Signed)

## 2014-05-14 NOTE — ED Notes (Signed)
Neb tx completed; pt st relief of SOB; lung sounds clear with sound upper airway exp wheezing; oxim 97%ra HR 95; FHTs located left mid abdomen, 170 and regular

## 2014-05-14 NOTE — ED Notes (Signed)
MD at bedside. 

## 2014-05-14 NOTE — ED Provider Notes (Signed)
[facility] Emergency Department Provider Note  ____________________________________________  Time seen: 5:15 AM  I have reviewed the triage vital signs and the nursing notes.   HISTORY  Chief Complaint Cough      HPI Natalie Russell is a 24 y.o. female presents with wheezing cough and dyspnea 2 weeks. Patient denies fever. Of note patient was seen and scribed azithromycin which she completed stating that she did indeed "become a little better."Patient admits to being a half a pack a day smoker.     History reviewed. No pertinent past medical history.  Patient Active Problem List   Diagnosis Date Noted  . Pregnancy 05/12/2014  . Abdominal pain affecting pregnancy 05/12/2014    Past Surgical History  Procedure Laterality Date  . Cholecystectomy    . Nasal fracture surgery    . Cholecystectomy N/A   . Reconstruction of nose      Current Outpatient Rx  Name  Route  Sig  Dispense  Refill  . albuterol (PROVENTIL HFA;VENTOLIN HFA) 108 (90 BASE) MCG/ACT inhaler   Inhalation   Inhale 1-2 puffs into the lungs every 6 (six) hours as needed for wheezing or shortness of breath.         . Prenatal Vit-Fe Fumarate-FA (PRENATAL MULTIVITAMIN) TABS tablet   Oral   Take 1 tablet by mouth daily at 12 noon.           Allergies Propoxyphene n-acetaminophen; Red dye; and Hydrocodone-acetaminophen  Family History  Problem Relation Age of Onset  . Diabetes Paternal Grandmother   . Heart disease Paternal Grandmother   . Hypertension Paternal Grandmother     Social History History  Substance Use Topics  . Smoking status: Light Tobacco Smoker -- 0.25 packs/day    Types: Cigarettes  . Smokeless tobacco: Not on file  . Alcohol Use: No    Review of Systems  Constitutional: Negative for fever. Eyes: Negative for visual changes. ENT: Negative for sore throat. Cardiovascular: Negative for chest pain. Respiratory: Negative for shortness of  breath. Positive for cough positive for wheezing Gastrointestinal: Negative for abdominal pain, vomiting and diarrhea. Genitourinary: Negative for dysuria. Musculoskeletal: Negative for back pain. Skin: Negative for rash. Neurological: Negative for headaches, focal weakness or numbness.  10-point ROS otherwise negative.  ____________________________________________   PHYSICAL EXAM:  VITAL SIGNS: ED Triage Vitals  Enc Vitals Group     BP 05/14/14 0206 115/73 mmHg     Pulse Rate 05/14/14 0206 89     Resp 05/14/14 0206 20     Temp 05/14/14 0206 97.6 F (36.4 C)     Temp Source 05/14/14 0206 Oral     SpO2 05/14/14 0206 95 %     Weight 05/14/14 0206 250 lb (113.399 kg)     Height 05/14/14 0206 5\' 5"  (1.651 m)     Head Cir --      Peak Flow --      Pain Score 05/14/14 0207 7     Pain Loc --      Pain Edu? --      Excl. in GC? --      Constitutional: Alert and oriented. Well appearing and in no distress. Eyes: Conjunctivae are normal. PERRL. Normal extraocular movements. ENT   Head: Normocephalic and atraumatic.   Nose: No congestion/rhinnorhea.   Mouth/Throat: Mucous membranes are moist.   Neck: No stridor. Hematological/Lymphatic/Immunilogical: No cervical lymphadenopathy. Cardiovascular: Normal rate, regular rhythm. Normal and symmetric distal pulses are present in all extremities. No murmurs,  rubs, or gallops. Respiratory: Normal respiratory effort without tachypnea nor retractions. Positive wheezing Gastrointestinal: Soft and nontender. No distention. There is no CVA tenderness. Genitourinary: deferred Musculoskeletal: Nontender with normal range of motion in all extremities. No joint effusions.  No lower extremity tenderness nor edema. Neurologic:  Normal speech and language. No gross focal neurologic deficits are appreciated. Speech is normal.  Skin:  Skin is warm, dry and intact. No rash noted. Psychiatric: Mood and affect are normal. Speech and behavior  are normal. Patient exhibits appropriate insight and judgment.  ____________________________________________        RADIOLOGY  Not performed  ____________________________________________    ____________________________________________   INITIAL IMPRESSION / ASSESSMENT AND PLAN / ED COURSE  Pertinent labs & imaging results that were available during my care of the patient were reviewed by me and considered in my medical decision making (see chart for details).  Given history and physical exam consistent with chronic bronchitis we'll prescribe albuterol inhaler and Augmentin. Prednisone not prescribe given that patient is [redacted] weeks pregnant.  ____________________________________________   FINAL CLINICAL IMPRESSION(S) / ED DIAGNOSES  Final diagnoses:  Chronic bronchitis with acute exacerbation     Darci Currentandolph N Shamar Engelmann, MD 05/27/14 [id number]

## 2014-05-14 NOTE — ED Notes (Signed)
Pt states she is currently [redacted] week pregnant and has recently been tx for walking pneumonia. Finished antibiotics for over a week. Pt has continued frequent productive cough and states she coughed up blood this evening. Denies fever.  Pt currently has no increased work of breathing or acute distress noted at this time.

## 2014-05-14 NOTE — ED Notes (Addendum)
Pt to triage c/o wheezing, cough, SOB; former smoker; upper airway wheezing noted with diminished breath sounds; frequent nonprod cough; st was rx zpak 2-3wks prior but coughing persists; also st was rx albuterol inhaler which helped but has ran out; Dr Manson PasseyBrown notified and order obtained for neb tx

## 2014-05-15 ENCOUNTER — Telehealth: Payer: Self-pay | Admitting: Obstetrics and Gynecology

## 2014-07-30 NOTE — Telephone Encounter (Signed)
error 

## 2015-03-17 ENCOUNTER — Encounter (HOSPITAL_COMMUNITY): Payer: Self-pay | Admitting: *Deleted

## 2017-10-04 ENCOUNTER — Emergency Department (HOSPITAL_BASED_OUTPATIENT_CLINIC_OR_DEPARTMENT_OTHER)
Admission: EM | Admit: 2017-10-04 | Discharge: 2017-10-04 | Disposition: A | Discharge status: Home / Self Care | Payer: Medicaid Other | Attending: Emergency Medicine | Admitting: Emergency Medicine

## 2017-10-04 ENCOUNTER — Emergency Department (HOSPITAL_BASED_OUTPATIENT_CLINIC_OR_DEPARTMENT_OTHER): Payer: Medicaid Other

## 2017-10-04 ENCOUNTER — Encounter (HOSPITAL_BASED_OUTPATIENT_CLINIC_OR_DEPARTMENT_OTHER): Payer: Self-pay | Admitting: *Deleted

## 2017-10-04 ENCOUNTER — Other Ambulatory Visit: Payer: Self-pay

## 2017-10-04 DIAGNOSIS — F17210 Nicotine dependence, cigarettes, uncomplicated: Secondary | ICD-10-CM | POA: Insufficient documentation

## 2017-10-04 DIAGNOSIS — M79601 Pain in right arm: Secondary | ICD-10-CM

## 2017-10-04 DIAGNOSIS — W19XXXA Unspecified fall, initial encounter: Secondary | ICD-10-CM

## 2017-10-04 MED ORDER — ACETAMINOPHEN 500 MG PO TABS
1000.0000 mg | ORAL_TABLET | Freq: Once | ORAL | Status: AC
  Administered 2017-10-04: 1000 mg via ORAL
  Filled 2017-10-04: qty 2

## 2017-10-04 MED ORDER — DIAZEPAM 5 MG PO TABS
5.0000 mg | ORAL_TABLET | Freq: Once | ORAL | Status: AC
  Administered 2017-10-04: 5 mg via ORAL
  Filled 2017-10-04: qty 1

## 2017-10-04 MED ORDER — METHOCARBAMOL 750 MG PO TABS: 750.0000 mg | ORAL_TABLET | Freq: Three times a day (TID) | ORAL | 0 refills | Status: AC | PRN

## 2017-10-04 NOTE — ED Notes (Signed)
Fell 0930 this am down steps  No loc  C/o rt shoulder pain  Pos radial pulse  States can move rt arm some but has increased pain

## 2017-10-04 NOTE — Discharge Instructions (Addendum)

## 2017-10-04 NOTE — ED Triage Notes (Signed)
She fell down 10 steps this am. Injury to her right shoulder. Her hand is numb. Limited ROM.

## 2017-10-05 NOTE — ED Provider Notes (Signed)
[facility] EMERGENCY DEPARTMENT Provider Note   CSN: [id number] Arrival date & time: 10/04/17  1728     History   Chief Complaint Chief Complaint  Patient presents with  . Shoulder Injury    HPI Natalie Russell is a 27 y.o. female who presents today for evaluation of right arm pain.  She reports that at about 930 this morning she fell down approximately 10 steps while carrying her child.  She reports that this was a mechanical fall.  She reports that she fell on her right arm, she has been able to move the right arm, throughout the day it has gotten more sore and painful.  She reports that her right hand is numb with decreased sensation.  She is previously had carpal tunnel surgery on the right hand.  She denies striking her head or passing out, she does have some neck pain.  Her hand is reportedly numb on both the dorsal and ventral aspects and that extends 1 to 2 inches up into the wrist bilaterally.  No shortness of breath or back pain.  Denies any other injuries.  She has not taken anything prior to arrival for her pain.  HPI  History reviewed. No pertinent past medical history.  Patient Active Problem List   Diagnosis Date Noted  . Pregnancy 05/12/2014  . Abdominal pain affecting pregnancy 05/12/2014    Past Surgical History:  Procedure Laterality Date  . CHOLECYSTECTOMY    . CHOLECYSTECTOMY N/A   . NASAL FRACTURE SURGERY    . RECONSTRUCTION OF NOSE       OB History    Gravida  3   Para  1   Term  1   Preterm  0   AB  1   Living  2     SAB  1   TAB  0   Ectopic  0   Multiple  0   Live Births  1            Home Medications    Prior to Admission medications   Medication Sig Start Date End Date Taking? Authorizing Provider  albuterol (PROVENTIL HFA;VENTOLIN HFA) 108 (90 BASE) MCG/ACT inhaler Inhale 2 puffs into the lungs every 4 (four) hours as needed for wheezing or shortness of breath. 05/14/14  Yes Darci Current, MD    amoxicillin-clavulanate (AUGMENTIN) 875-125 MG per tablet Take 1 tablet by mouth 2 (two) times daily. 05/14/14   Darci Current, MD  methocarbamol (ROBAXIN) 750 MG tablet Take 1-2 tablets (750-1,500 mg total) by mouth 3 (three) times daily as needed for muscle spasms. 10/04/17   Cristina Gong, PA-C  Prenatal Vit-Fe Fumarate-FA (PRENATAL MULTIVITAMIN) TABS tablet Take 1 tablet by mouth daily at 12 noon.    [provider]    Family History Family History  Problem Relation Age of Onset  . Diabetes Paternal Grandmother   . Heart disease Paternal Grandmother   . Hypertension Paternal Grandmother     Social History Social History   Tobacco Use  . Smoking status: Light Tobacco Smoker    Packs/day: 0.25    Types: Cigarettes  . Smokeless tobacco: Never Used  Substance Use Topics  . Alcohol use: No  . Drug use: No     Allergies   Propoxyphene n-acetaminophen; Red dye; and Hydrocodone-acetaminophen   Review of Systems Review of Systems  Constitutional: Negative for chills and fever.  HENT: Negative for ear pain and sore throat.   Eyes: Negative for  pain and visual disturbance.  Respiratory: Negative for cough, chest tightness and shortness of breath.   Cardiovascular: Negative for chest pain and palpitations.  Gastrointestinal: Negative for abdominal pain, nausea and vomiting.  Genitourinary: Negative for dysuria and hematuria.  Musculoskeletal: Positive for neck pain. Negative for arthralgias, back pain and neck stiffness.  Skin: Negative for color change and rash.  Neurological: Positive for numbness (Right hand). Negative for dizziness, seizures, syncope, weakness and headaches.  All other systems reviewed and are negative.    Physical Exam Updated Vital Signs BP (!) 143/107 (BP Location: Left Arm)   Pulse 90   Temp 98.2 F (36.8 C) (Oral)   Resp 18   Wt 115.2 kg   LMP 09/16/2017   SpO2 100%   BMI 42.27 kg/m   Physical Exam  Constitutional: She  is oriented to person, place, and time. She appears well-developed and well-nourished. No distress.  HENT:  Head: Normocephalic and atraumatic.  Mouth/Throat: Oropharynx is clear and moist.  Eyes: Conjunctivae are normal.  Neck: Normal range of motion. Neck supple.  Cardiovascular: Normal rate, regular rhythm, normal heart sounds and intact distal pulses.  No murmur heard. 2+ radial pulses bilaterally, right hand is warm and well perfused  Pulmonary/Chest: Effort normal and breath sounds normal. No stridor. No respiratory distress.  Abdominal: Soft. There is no tenderness. There is no guarding.  Musculoskeletal: She exhibits no edema.  Diffuse tenderness to palpation over C-spine both midline and paraspinally.  There is tenderness to palpation over the right anterior and posterior shoulders and proximal arm.  There is no localized tenderness, crepitus, deformity.  Range of motion is limited secondary to pain.    Neurological: She is alert and oriented to person, place, and time.  Her speech is not slurred, there is no facial droop, she has 5/5 grip strength bilaterally, however reports that moving her right hand causes a pins-and-needles feeling in her right hand.  She subjectively has decreased sensation to all of her fingers, palm, dorsal surface, and wrist of her right hand, this extends approximately 5 cm up to the wrist after which her sensation is normal.  She has normal sensation over the remainder of her right arm.  Skin: Skin is warm and dry. She is not diaphoretic.  Psychiatric: She has a normal mood and affect.  Nursing note and vitals reviewed.    ED Treatments / Results  Labs (all labs ordered are listed, but only abnormal results are displayed) Labs Reviewed - No data to display  EKG None  Radiology Dg Shoulder Right  Result Date: 10/04/2017 CLINICAL DATA:  Acute RIGHT shoulder pain following fall today. Initial encounter. EXAM: RIGHT SHOULDER - 2+ VIEW COMPARISON:   None. FINDINGS: There is no evidence of fracture or dislocation. There is no evidence of arthropathy or other focal bone abnormality. Soft tissues are unremarkable. IMPRESSION: Negative. Electronically Signed   By: Harmon Pier M.D.   On: 10/04/2017 18:09   Ct Cervical Spine Wo Contrast  Result Date: 10/04/2017 CLINICAL DATA:  Right shoulder pain and right hand numbness following a fall down 10 steps this morning. EXAM: CT CERVICAL SPINE WITHOUT CONTRAST TECHNIQUE: Multidetector CT imaging of the cervical spine was performed without intravenous contrast. Multiplanar CT image reconstructions were also generated. COMPARISON:  None. FINDINGS: Alignment: Mild reversal of the normal cervical lordosis. No subluxations. Skull base and vertebrae: No acute fracture. No primary bone lesion or focal pathologic process. Soft tissues and spinal canal: No prevertebral fluid or swelling. No  visible canal hematoma. Disc levels:  Normal. Upper chest: Clear lung apices. Other: None. IMPRESSION: Mild reversal of the normal cervical lordosis. Otherwise, normal examination. Electronically Signed   By: Beckie Salts M.D.   On: 10/04/2017 19:10    Procedures Procedures (including critical care time)  Medications Ordered in ED Medications  diazepam (VALIUM) tablet 5 mg (5 mg Oral Given 10/04/17 1834)  acetaminophen (TYLENOL) tablet 1,000 mg (1,000 mg Oral Given 10/04/17 1833)     Initial Impression / Assessment and Plan / ED Course  I have reviewed the triage vital signs and the nursing notes.  Pertinent labs & imaging results that were available during my care of the patient were reviewed by me and considered in my medical decision making (see chart for details).    Patient presents today for evaluation of right arm pain after she had a mechanical fall this morning.  Initially she reported that her hand was numb, and subjectively had decreased sensation over the entire hand equally, both ventrally and dorsally that  extended approximately 5 cm upper arm into her wrist.  This area does not correlate with any specific dermatome or spinal nerve root.  She does have neck pain, therefore CT neck was performed without evidence of fracture or other abnormalities.  Right shoulder x-ray was performed without fractures noted.  She was treated with Valium and Tylenol while in the department, after this her subjective numbness completely resolved and her pain was markedly improved.  Suspect muscle spasm, especially given the worsening nature of her symptoms throughout the day, rather than immediate onset.    This patient was discussed with Dr. Juleen China who agreed to no further work up indicated at this time.   Recommended conservative care, rest, will give short course of muscle relaxers.  Patient is not breast-feeding, and denies possibility of pregnancy.  Return precautions were discussed with patient who states their understanding.  At the time of discharge patient denied any unaddressed complaints or concerns.  Patient is agreeable for discharge home.   Final Clinical Impressions(s) / ED Diagnoses   Final diagnoses:  Right arm pain  Fall, initial encounter    ED Discharge Orders         Ordered    methocarbamol (ROBAXIN) 750 MG tablet  3 times daily PRN     10/04/17 2028           Cristina Gong, PA-C 10/05/17 1610    Raeford Razor, MD 10/08/17 [phone]

## 2017-11-20 ENCOUNTER — Emergency Department
Admission: EM | Admit: 2017-11-20 | Discharge: 2017-11-20 | Disposition: A | Discharge status: Home / Self Care | Payer: Medicaid Other | Attending: Emergency Medicine | Admitting: Emergency Medicine

## 2017-11-20 ENCOUNTER — Emergency Department: Payer: Medicaid Other

## 2017-11-20 ENCOUNTER — Other Ambulatory Visit: Payer: Self-pay

## 2017-11-20 ENCOUNTER — Encounter: Payer: Self-pay | Admitting: Emergency Medicine

## 2017-11-20 DIAGNOSIS — N760 Acute vaginitis: Secondary | ICD-10-CM | POA: Diagnosis not present

## 2017-11-20 DIAGNOSIS — B9689 Other specified bacterial agents as the cause of diseases classified elsewhere: Secondary | ICD-10-CM

## 2017-11-20 DIAGNOSIS — R1032 Left lower quadrant pain: Secondary | ICD-10-CM | POA: Diagnosis not present

## 2017-11-20 DIAGNOSIS — R102 Pelvic and perineal pain: Secondary | ICD-10-CM | POA: Diagnosis present

## 2017-11-20 DIAGNOSIS — Z79899 Other long term (current) drug therapy: Secondary | ICD-10-CM | POA: Insufficient documentation

## 2017-11-20 DIAGNOSIS — F17210 Nicotine dependence, cigarettes, uncomplicated: Secondary | ICD-10-CM | POA: Diagnosis not present

## 2017-11-20 LAB — COMPREHENSIVE METABOLIC PANEL
ALT: 21 U/L (ref 0–44)
AST: 16 U/L (ref 15–41)
Albumin: 4.2 g/dL (ref 3.5–5.0)
Alkaline Phosphatase: 95 U/L (ref 38–126)
Anion gap: 8 (ref 5–15)
BUN: 9 mg/dL (ref 6–20)
CHLORIDE: 105 mmol/L (ref 98–111)
CO2: 25 mmol/L (ref 22–32)
CREATININE: 0.49 mg/dL (ref 0.44–1.00)
Calcium: 9.2 mg/dL (ref 8.9–10.3)
GFR calc Af Amer: >60 mL/min (ref >60)
GFR calc non Af Amer: >60 mL/min (ref >60)
Glucose, Bld: 92 mg/dL (ref 70–99)
Potassium: 4 mmol/L (ref 3.5–5.1)
SODIUM: 138 mmol/L (ref 135–145)
Total Bilirubin: 0.3 mg/dL (ref 0.3–1.2)
Total Protein: 7.9 g/dL (ref 6.5–8.1)

## 2017-11-20 LAB — URINALYSIS, COMPLETE (UACMP) WITH MICROSCOPIC
BACTERIA UA: NONE SEEN
BILIRUBIN URINE: NEGATIVE
GLUCOSE, UA: NEGATIVE mg/dL
Hgb urine dipstick: NEGATIVE
Ketones, ur: NEGATIVE mg/dL
Leukocytes, UA: NEGATIVE
NITRITE: NEGATIVE
PH: 6 (ref 5.0–8.0)
Protein, ur: NEGATIVE mg/dL
Specific Gravity, Urine: 1.012 (ref 1.005–1.030)

## 2017-11-20 LAB — CBC WITH DIFFERENTIAL/PLATELET
Abs Immature Granulocytes: 0.06 10*3/uL (ref 0.00–0.07)
Basophils Absolute: 0.1 10*3/uL (ref 0.0–0.1)
Basophils Relative: 1 %
Eosinophils Absolute: 0.2 10*3/uL (ref 0.0–0.5)
Eosinophils Relative: 2 %
HEMATOCRIT: 45.1 % (ref 36.0–46.0)
HEMOGLOBIN: 14.8 g/dL (ref 12.0–15.0)
Immature Granulocytes: 1 %
LYMPHS ABS: 3.7 10*3/uL (ref 0.7–4.0)
Lymphocytes Relative: 32 %
MCH: 30.1 pg (ref 26.0–34.0)
MCHC: 32.8 g/dL (ref 30.0–36.0)
MCV: 91.7 fL (ref 80.0–100.0)
Monocytes Absolute: 0.6 10*3/uL (ref 0.1–1.0)
Monocytes Relative: 5 %
Neutro Abs: 7.1 10*3/uL (ref 1.7–7.7)
Neutrophils Relative %: 59 %
Platelets: 323 10*3/uL (ref 150–400)
RBC: 4.92 MIL/uL (ref 3.87–5.11)
RDW: 13.2 % (ref 11.5–15.5)
WBC: 11.6 10*3/uL — ABNORMAL HIGH (ref 4.0–10.5)
nRBC: 0 % (ref 0.0–0.2)

## 2017-11-20 LAB — WET PREP, GENITAL
SPERM: NONE SEEN
TRICH WET PREP: NONE SEEN
YEAST WET PREP: NONE SEEN

## 2017-11-20 LAB — CHLAMYDIA/NGC RT PCR (ARMC ONLY)
Chlamydia Tr: NOT DETECTED
N GONORRHOEAE: NOT DETECTED

## 2017-11-20 LAB — POC URINE PREG, ED: PREG TEST UR: NEGATIVE

## 2017-11-20 LAB — HCG, QUANTITATIVE, PREGNANCY

## 2017-11-20 MED ORDER — METRONIDAZOLE 500 MG PO TABS: 500.0000 mg | ORAL_TABLET | Freq: Two times a day (BID) | ORAL | 0 refills | Status: DC

## 2017-11-20 MED ORDER — FENTANYL CITRATE (PF) 100 MCG/2ML IJ SOLN
50.0000 ug | Freq: Once | INTRAMUSCULAR | Status: AC
  Administered 2017-11-20: 50 ug via INTRAVENOUS
  Filled 2017-11-20: qty 2

## 2017-11-20 MED ORDER — METRONIDAZOLE 500 MG PO TABS: 500.0000 mg | ORAL_TABLET | Freq: Two times a day (BID) | ORAL | 0 refills | Status: AC

## 2017-11-20 MED ORDER — IOPAMIDOL (ISOVUE-300) INJECTION 61%
100.0000 mL | Freq: Once | INTRAVENOUS | Status: AC | PRN
  Administered 2017-11-20: 100 mL via INTRAVENOUS
  Filled 2017-11-20: qty 100

## 2017-11-20 MED ORDER — KETOROLAC TROMETHAMINE 30 MG/ML IJ SOLN
15.0000 mg | Freq: Once | INTRAMUSCULAR | Status: AC
  Administered 2017-11-20: 15 mg via INTRAVENOUS
  Filled 2017-11-20: qty 1
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
## 2017-11-20 NOTE — ED Notes (Signed)
Hx ovarian cysts; right pelvic pain, brought in by EMS with no distress noted; staying at [facility]"

## 2017-11-20 NOTE — Discharge Instructions (Addendum)

## 2018-02-08 ENCOUNTER — Ambulatory Visit (INDEPENDENT_AMBULATORY_CARE_PROVIDER_SITE_OTHER): Payer: Medicaid Other | Admitting: Obstetrics & Gynecology

## 2018-02-08 VITALS — BP 135/83 | HR 85 | Wt 269.0 lb

## 2018-02-08 DIAGNOSIS — N940 Mittelschmerz: Secondary | ICD-10-CM

## 2018-02-08 DIAGNOSIS — R1084 Generalized abdominal pain: Secondary | ICD-10-CM

## 2018-02-08 LAB — POCT URINALYSIS DIP (DEVICE)
Bilirubin Urine: NEGATIVE
GLUCOSE, UA: NEGATIVE mg/dL
HGB URINE DIPSTICK: NEGATIVE
Ketones, ur: NEGATIVE mg/dL
Leukocytes, UA: NEGATIVE
Nitrite: NEGATIVE
Protein, ur: NEGATIVE mg/dL
SPECIFIC GRAVITY, URINE: 1.025 (ref 1.005–1.030)
Urobilinogen, UA: 0.2 mg/dL (ref 0.0–1.0)
pH: 6.5 (ref 5.0–8.0)

## 2018-02-08 LAB — POCT PREGNANCY, URINE: PREG TEST UR: NEGATIVE

## 2018-02-08 MED ORDER — LEVONORGEST-ETH ESTRAD 91-DAY 0.15-0.03 &0.01 MG PO TABS: 1.0000 | ORAL_TABLET | Freq: Every day | ORAL | 4 refills | Status: AC

## 2018-02-08 NOTE — Progress Notes (Signed)
Patient ID: Natalie Russell, female   DOB: 09-30-1990, 28 y.o.   MRN: [id number]  Chief Complaint  Patient presents with  . Abdominal Pain    HPI Natalie Russell is a 28 y.o. female.  Single P3 (11, 3, and 1 yo kids). She has been abstinent for 5 months. She is here for follow up after a ER visit 11/19 for lower pelvic pain. At that visit the CT and u/s were normal.    No past medical history on file.  Past Surgical History:  Procedure Laterality Date  . CHOLECYSTECTOMY    . CHOLECYSTECTOMY N/A   . NASAL FRACTURE SURGERY    . RECONSTRUCTION OF NOSE      Family History  Problem Relation Age of Onset  . Diabetes Paternal Grandmother   . Heart disease Paternal Grandmother   . Hypertension Paternal Grandmother     Social History Social History   Tobacco Use  . Smoking status: Light Tobacco Smoker    Packs/day: 0.25    Types: Cigarettes  . Smokeless tobacco: Never Used  Substance Use Topics  . Alcohol use: No  . Drug use: No    Allergies  Allergen Reactions  . Wasp Venom Protein Anaphylaxis  . Propoxyphene N-Acetaminophen   . Red Dye Hives  . Hydrocodone-Acetaminophen Itching    Current Outpatient Medications  Medication Sig Dispense Refill  . albuterol (PROVENTIL HFA;VENTOLIN HFA) 108 (90 BASE) MCG/ACT inhaler Inhale 2 puffs into the lungs every 4 (four) hours as needed for wheezing or shortness of breath. 1 Inhaler 2  . amoxicillin-clavulanate (AUGMENTIN) 875-125 MG per tablet Take 1 tablet by mouth 2 (two) times daily. (Patient not taking: Reported on 02/08/2018) 20 tablet 0  . methocarbamol (ROBAXIN) 750 MG tablet Take 1-2 tablets (750-1,500 mg total) by mouth 3 (three) times daily as needed for muscle spasms. (Patient not taking: Reported on 02/08/2018) 18 tablet 0  . Prenatal Vit-Fe Fumarate-FA (PRENATAL MULTIVITAMIN) TABS tablet Take 1 tablet by mouth daily at 12 noon.     No current facility-administered medications for this visit.     Review of  Systems Review of Systems  Blood pressure 135/83, pulse 85, weight 269 lb (122 kg), unknown if currently breastfeeding.  Physical Exam Physical Exam Breathing, conversing, and ambulating normally Well nourished, well hydrated White female, no apparent distress She is accompanied by 2 officers of the court as she is currently incarcerated. Abd- benign  Data Reviewed CT and ultrasound as above  Assessment   mittelschrmerz pain    Plan   Start extended cycle OCPs with NMP Come back prn Rec BP in 2-3 months        Martise Waddell C Monteen Toops 02/08/2018, 9:12 AM

## 2018-02-08 NOTE — Progress Notes (Signed)
Hx of ovarian cyst

## 2018-02-18 ENCOUNTER — Ambulatory Visit (HOSPITAL_COMMUNITY): Payer: Medicaid Other

## 2018-03-16 ENCOUNTER — Encounter (HOSPITAL_BASED_OUTPATIENT_CLINIC_OR_DEPARTMENT_OTHER): Payer: Self-pay | Admitting: Emergency Medicine

## 2018-03-16 ENCOUNTER — Emergency Department (HOSPITAL_BASED_OUTPATIENT_CLINIC_OR_DEPARTMENT_OTHER)
Admission: EM | Admit: 2018-03-16 | Discharge: 2018-03-16 | Disposition: A | Discharge status: Home / Self Care | Payer: Medicaid Other | Attending: Emergency Medicine | Admitting: Emergency Medicine

## 2018-03-16 ENCOUNTER — Other Ambulatory Visit: Payer: Self-pay

## 2018-03-16 DIAGNOSIS — J029 Acute pharyngitis, unspecified: Secondary | ICD-10-CM | POA: Diagnosis present

## 2018-03-16 DIAGNOSIS — J020 Streptococcal pharyngitis: Secondary | ICD-10-CM

## 2018-03-16 DIAGNOSIS — F17210 Nicotine dependence, cigarettes, uncomplicated: Secondary | ICD-10-CM | POA: Insufficient documentation

## 2018-03-16 LAB — GROUP A STREP BY PCR: Group A Strep by PCR: DETECTED — AB

## 2018-03-16 MED ORDER — DEXAMETHASONE SODIUM PHOSPHATE 10 MG/ML IJ SOLN
10.0000 mg | Freq: Once | INTRAMUSCULAR | Status: AC
  Administered 2018-03-16: 10 mg via INTRAMUSCULAR
  Filled 2018-03-16: qty 1

## 2018-03-16 MED ORDER — PENICILLIN G BENZATHINE 1200000 UNIT/2ML IM SUSP
1.2000 10*6.[IU] | Freq: Once | INTRAMUSCULAR | Status: AC
  Administered 2018-03-16: 1.2 10*6.[IU] via INTRAMUSCULAR
  Filled 2018-03-16: qty 2

## 2018-03-16 NOTE — ED Provider Notes (Signed)
[facility] EMERGENCY DEPARTMENT Provider Note   CSN: [id number] Arrival date & time: 03/16/18  1409    History   Chief Complaint Chief Complaint  Patient presents with  . Sore Throat    HPI Natalie Russell is a 28 y.o. female.     Patient is a 28 year old female who presents with a sore throat.  She states that started yesterday and got progressively worse through today.  She states she has a hard time swallowing and that hurts to swallow.  No shortness of breath or difficulty controlling her secretions.  No runny nose or nasal congestion.  No known fevers.  No nausea or vomiting.  She has not taken any home medications for the symptoms.     History reviewed. No pertinent past medical history.  There are no active problems to display for this patient.   Past Surgical History:  Procedure Laterality Date  . CHOLECYSTECTOMY    . CHOLECYSTECTOMY N/A   . NASAL FRACTURE SURGERY    . RECONSTRUCTION OF NOSE       OB History    Gravida  3   Para  1   Term  1   Preterm  0   AB  1   Living  2     SAB  1   TAB  0   Ectopic  0   Multiple  0   Live Births  1            Home Medications    Prior to Admission medications   Medication Sig Start Date End Date Taking? Authorizing Provider  albuterol (PROVENTIL HFA;VENTOLIN HFA) 108 (90 BASE) MCG/ACT inhaler Inhale 2 puffs into the lungs every 4 (four) hours as needed for wheezing or shortness of breath. 05/14/14   Darci Current, MD  amoxicillin-clavulanate (AUGMENTIN) 875-125 MG per tablet Take 1 tablet by mouth 2 (two) times daily. Patient not taking: Reported on 02/08/2018 05/14/14   Darci Current, MD  Levonorgestrel-Ethinyl Estradiol (AMETHIA,CAMRESE) 0.15-0.03 &0.01 MG tablet Take 1 tablet by mouth daily. 02/08/18   Allie Bossier, MD  methocarbamol (ROBAXIN) 750 MG tablet Take 1-2 tablets (750-1,500 mg total) by mouth 3 (three) times daily as needed for muscle spasms. Patient not taking:  Reported on 02/08/2018 10/04/17   Cristina Gong, PA-C  Prenatal Vit-Fe Fumarate-FA (PRENATAL MULTIVITAMIN) TABS tablet Take 1 tablet by mouth daily at 12 noon.    [provider]    Family History Family History  Problem Relation Age of Onset  . Diabetes Paternal Grandmother   . Heart disease Paternal Grandmother   . Hypertension Paternal Grandmother     Social History Social History   Tobacco Use  . Smoking status: Light Tobacco Smoker    Packs/day: 0.25    Types: Cigarettes  . Smokeless tobacco: Never Used  Substance Use Topics  . Alcohol use: No  . Drug use: No     Allergies   Wasp venom protein; Propoxyphene n-acetaminophen; Red dye; and Hydrocodone-acetaminophen   Review of Systems Review of Systems  Constitutional: Negative for chills, diaphoresis, fatigue and fever.  HENT: Positive for sore throat and trouble swallowing. Negative for congestion, rhinorrhea, sneezing and voice change.   Eyes: Negative.   Respiratory: Negative for cough, chest tightness and shortness of breath.   Cardiovascular: Negative for chest pain and leg swelling.  Gastrointestinal: Negative for abdominal pain, blood in stool, diarrhea, nausea and vomiting.  Genitourinary: Negative for difficulty urinating, flank pain, frequency  and hematuria.  Musculoskeletal: Negative for arthralgias and back pain.  Skin: Negative for rash.  Neurological: Negative for dizziness, speech difficulty, weakness, numbness and headaches.     Physical Exam Updated Vital Signs BP (!) 134/98 (BP Location: Right Arm)   Pulse (!) 102   Temp 98.6 F (37 C) (Oral)   Resp 18   Ht  (1.651 m)   Wt 113.4 kg   LMP 02/27/2018   SpO2 100%   BMI 41.60 kg/m   Physical Exam Constitutional:      Appearance: She is well-developed.  HENT:     Head: Normocephalic and atraumatic.     Right Ear: Tympanic membrane normal.     Left Ear: Tympanic membrane normal.     Nose: No congestion or rhinorrhea.      Comments: Patient has erythema with exudates to the posterior pharynx.  No trismus.  Uvula is midline.  No peritonsillar fullness    Mouth/Throat:     Pharynx: Uvula midline. Oropharyngeal exudate and posterior oropharyngeal erythema present. No uvula swelling.  Eyes:     Pupils: Pupils are equal, round, and reactive to light.  Neck:     Musculoskeletal: Normal range of motion and neck supple.  Cardiovascular:     Rate and Rhythm: Normal rate and regular rhythm.     Heart sounds: Normal heart sounds.  Pulmonary:     Effort: Pulmonary effort is normal. No respiratory distress.     Breath sounds: Normal breath sounds. No wheezing or rales.  Chest:     Chest wall: No tenderness.  Abdominal:     General: Bowel sounds are normal.     Palpations: Abdomen is soft.     Tenderness: There is no abdominal tenderness. There is no guarding or rebound.  Musculoskeletal: Normal range of motion.  Lymphadenopathy:     Cervical: Cervical adenopathy present.  Skin:    General: Skin is warm and dry.     Findings: No rash.  Neurological:     Mental Status: She is alert and oriented to person, place, and time.      ED Treatments / Results  Labs (all labs ordered are listed, but only abnormal results are displayed) Labs Reviewed  GROUP A STREP BY PCR - Abnormal; Notable for the following components:      Result Value   Group A Strep by PCR DETECTED (*)    All other components within normal limits    EKG None  Radiology No results found.  Procedures Procedures (including critical care time)  Medications Ordered in ED Medications  penicillin g benzathine (BICILLIN LA) 1200000 UNIT/2ML injection 1.2 Million Units (has no administration in time range)  dexamethasone (DECADRON) injection 10 mg (has no administration in time range)     Initial Impression / Assessment and Plan / ED Course  I have reviewed the triage vital signs and the nursing notes.  Pertinent labs & imaging  results that were available during my care of the patient were reviewed by me and considered in my medical decision making (see chart for details).        Patient presents with sore throat.  Her strep test is positive.  She has no clinical suggestions of a peritonsillar abscess or airway compromise.  She opted for Bicillin.  She was also given a shot of Decadron.  She was otherwise advised in symptomatic care instructions.  Return precautions were given. Final Clinical Impressions(s) / ED Diagnoses   Final diagnoses:  Strep  pharyngitis    ED Discharge Orders    None       Rolan Bucco, MD 03/16/18 1541

## 2018-03-16 NOTE — ED Triage Notes (Addendum)
Sore throat since this morning. Also reports neck swelling

## 2018-06-09 ENCOUNTER — Emergency Department
Admission: EM | Admit: 2018-06-09 | Discharge: 2018-06-09 | Disposition: A | Discharge status: Home / Self Care | Payer: Medicaid Other | Attending: Emergency Medicine | Admitting: Emergency Medicine

## 2018-06-09 ENCOUNTER — Encounter: Payer: Self-pay | Admitting: *Deleted

## 2018-06-09 ENCOUNTER — Ambulatory Visit
Admission: EM | Admit: 2018-06-09 | Discharge: 2018-06-09 | Disposition: A | Discharge status: Home / Self Care | Payer: No Typology Code available for payment source | Source: Ambulatory Visit | Attending: Emergency Medicine | Admitting: Emergency Medicine

## 2018-06-09 ENCOUNTER — Other Ambulatory Visit: Payer: Self-pay

## 2018-06-09 DIAGNOSIS — Z0441 Encounter for examination and observation following alleged adult rape: Secondary | ICD-10-CM | POA: Insufficient documentation

## 2018-06-09 DIAGNOSIS — F17210 Nicotine dependence, cigarettes, uncomplicated: Secondary | ICD-10-CM | POA: Insufficient documentation

## 2018-06-09 DIAGNOSIS — R102 Pelvic and perineal pain: Secondary | ICD-10-CM | POA: Diagnosis present

## 2018-06-09 DIAGNOSIS — T7421XA Adult sexual abuse, confirmed, initial encounter: Secondary | ICD-10-CM | POA: Diagnosis not present

## 2018-06-09 DIAGNOSIS — Z3202 Encounter for pregnancy test, result negative: Secondary | ICD-10-CM | POA: Diagnosis not present

## 2018-06-09 DIAGNOSIS — N939 Abnormal uterine and vaginal bleeding, unspecified: Secondary | ICD-10-CM | POA: Insufficient documentation

## 2018-06-09 DIAGNOSIS — Z79899 Other long term (current) drug therapy: Secondary | ICD-10-CM | POA: Insufficient documentation

## 2018-06-09 LAB — POCT PREGNANCY, URINE: Preg Test, Ur: NEGATIVE

## 2018-06-09 MED ORDER — OXYCODONE-ACETAMINOPHEN 5-325 MG PO TABS
1.0000 | ORAL_TABLET | Freq: Once | ORAL | Status: AC
  Administered 2018-06-09: 1 via ORAL
  Filled 2018-06-09: qty 1

## 2018-06-09 MED ORDER — LIDOCAINE HCL (PF) 1 % IJ SOLN
0.9000 mL | Freq: Once | INTRAMUSCULAR | Status: AC
  Administered 2018-06-09: 0.9 mL
  Filled 2018-06-09: qty 5

## 2018-06-09 MED ORDER — KETOROLAC TROMETHAMINE 30 MG/ML IJ SOLN
15.0000 mg | Freq: Once | INTRAMUSCULAR | Status: AC
  Administered 2018-06-09: 15 mg via INTRAMUSCULAR
  Filled 2018-06-09: qty 1

## 2018-06-09 MED ORDER — METRONIDAZOLE 500 MG PO TABS
2000.0000 mg | ORAL_TABLET | Freq: Once | ORAL | Status: AC
  Administered 2018-06-09: 2000 mg via ORAL
  Filled 2018-06-09: qty 4

## 2018-06-09 MED ORDER — CEFTRIAXONE SODIUM 250 MG IJ SOLR
250.0000 mg | Freq: Once | INTRAMUSCULAR | Status: AC
  Administered 2018-06-09: 250 mg via INTRAMUSCULAR
  Filled 2018-06-09: qty 250

## 2018-06-09 MED ORDER — ULIPRISTAL ACETATE 30 MG PO TABS
30.0000 mg | ORAL_TABLET | Freq: Once | ORAL | Status: AC
  Administered 2018-06-09: 30 mg via ORAL
  Filled 2018-06-09: qty 1

## 2018-06-09 NOTE — ED Notes (Signed)
SANE nurse - Ivin Booty [phone] called and notified of patient in ED. RN will need to be notified after medical clearance.

## 2018-06-09 NOTE — ED Notes (Signed)
First Nurse Note: Pt to ED c/o pelvic pain, decline to give other details
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
## 2018-06-09 NOTE — ED Notes (Signed)
Pt discharged into the care of the SANE nurse, Ivin Booty at this time.
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
## 2018-06-10 LAB — POCT PREGNANCY, URINE: Preg Test, Ur: NEGATIVE

## 2018-06-10 NOTE — SANE Note (Signed)
This RN contacted per telecon by [facility] secretary Ms. Cathy. Ms. Natalie Russell stated that patient had contacted the ED at [facility] requesting her two prescriptions that had not been called in. Patient had been evaluated by [facility] on 06/09/2018.  Ms. Natalie Russell provided this RN with contact phone number for patient.  This RN reviewed documentation of patient's visit on June 8th. Medical Provider and SANE documentation without reference to prescriptions. This RN contacted patient at number provided by [facility] [phone]. Verified with patient name and DOB. Patient stated "I was told by the doctor that he would call in a prescription for motrin and percocet and I never got it".  This RN explained that percocet required a written prescription prior to discharge and is not a medication that can be called into a pharmacy. This RN explained to patient that motrin can be purchased OTC for pain control as well as tylenol. This RN explained to patient that chart review did not reference any prescriptions for discharge.  Instructed patient that if her pain was not being adequately controlled by motrin/tylenol that she could return to [facility] for further evaluation  by a medical provider.  Patient verbalized understanding. No further action required by this RN at this time.
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
## 2019-01-01 IMAGING — US US ART/VEN ABD/PELV/SCROTUM DOPPLER LTD
1 series · 13 of 25 positions shown · non-contrast
Comparison: None.

CLINICAL DATA: Left lower quadrant abdominal pain.

EXAM:
TRANSABDOMINAL AND TRANSVAGINAL ULTRASOUND OF PELVIS
DOPPLER ULTRASOUND OF OVARIES
TECHNIQUE: Both transabdominal and transvaginal ultrasound examinations of the
pelvis were performed. Transabdominal technique was performed for
global imaging of the pelvis including uterus, ovaries, adnexal
regions, and pelvic cul-de-sac.
It was necessary to proceed with endovaginal exam following the
transabdominal exam to visualize the uterus, endometrium, and
ovaries.. Color and duplex Doppler ultrasound was utilized to
evaluate blood flow to the ovaries.

[Series 1: us art/ven abd/pelv/scrotum doppler ltd · 0.26mm/px · 13 of 79 slices shown]
[im 1/79]
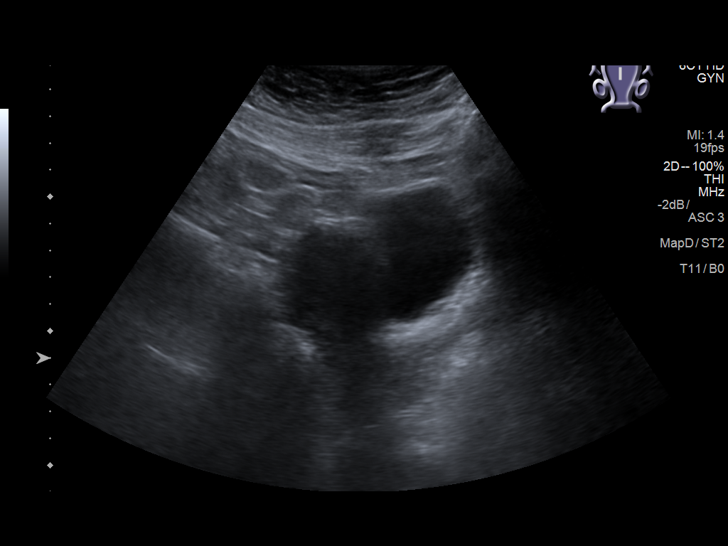
[im 7/79]
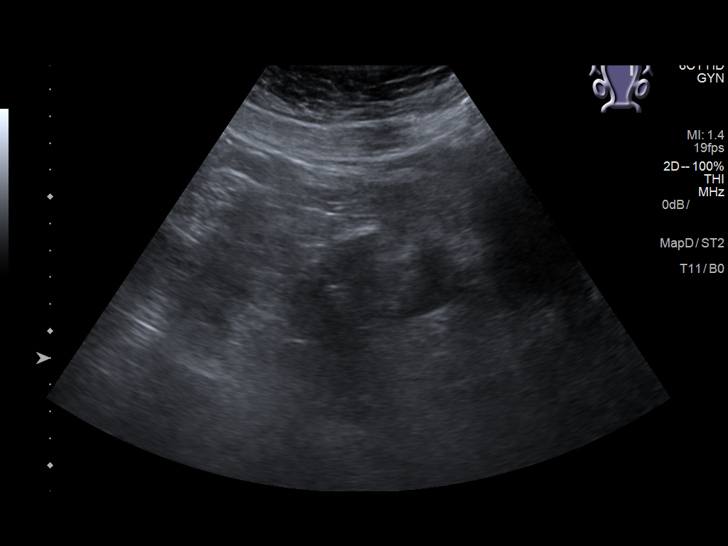
[im 14/79]
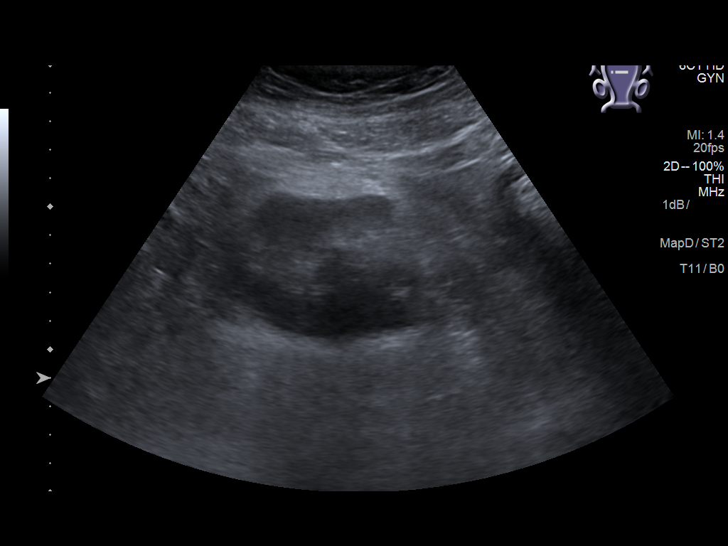
[im 20/79]
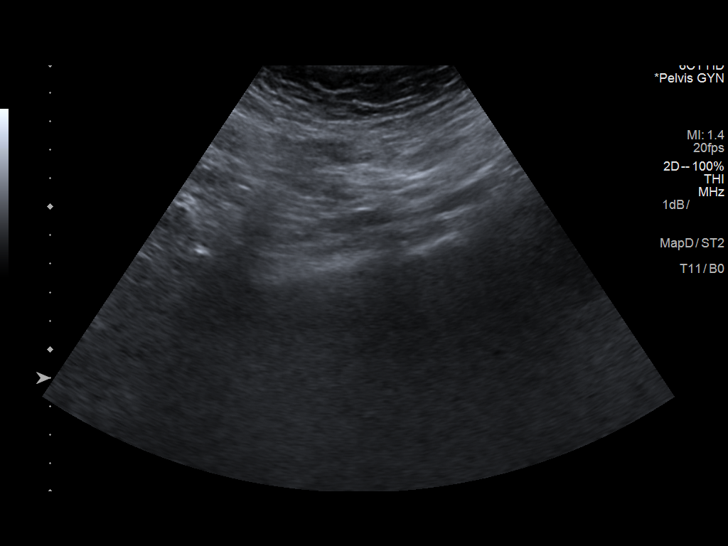
[im 27/79]
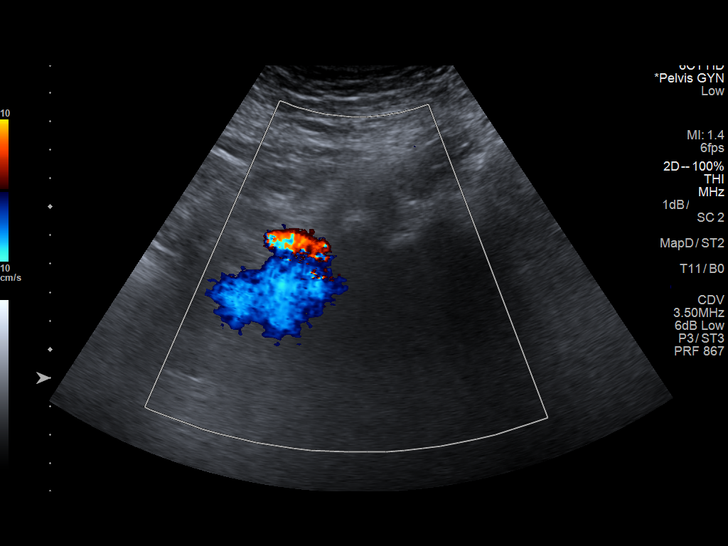
[im 33/79]
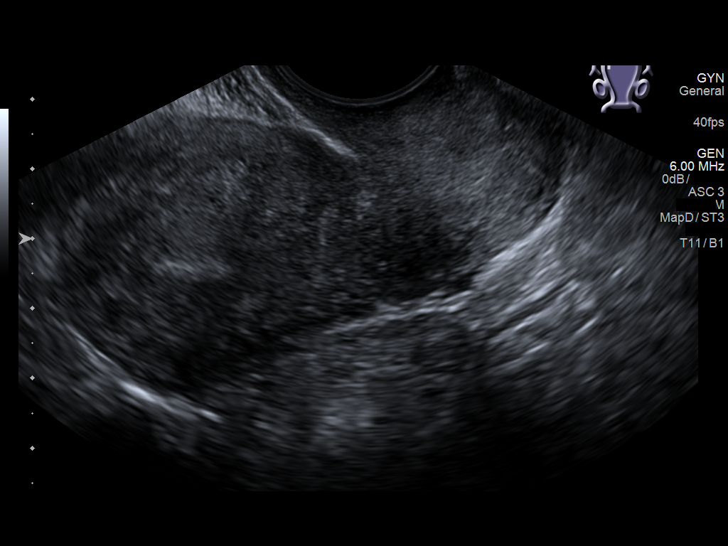
[im 40/79]
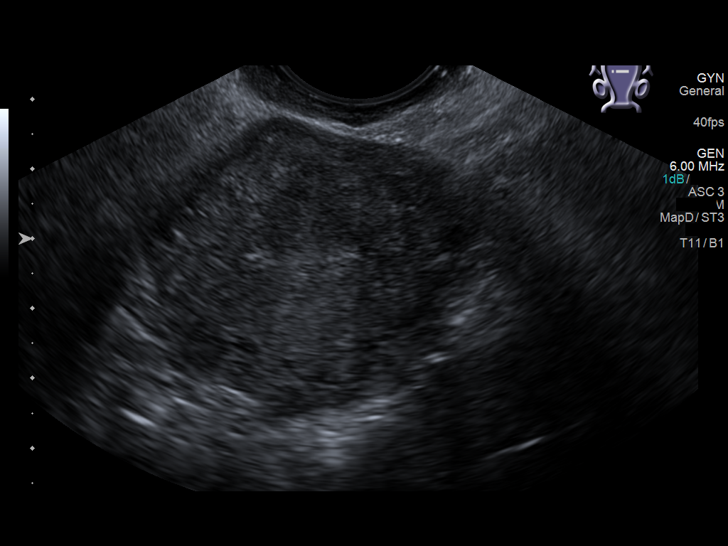
[im 46/79]
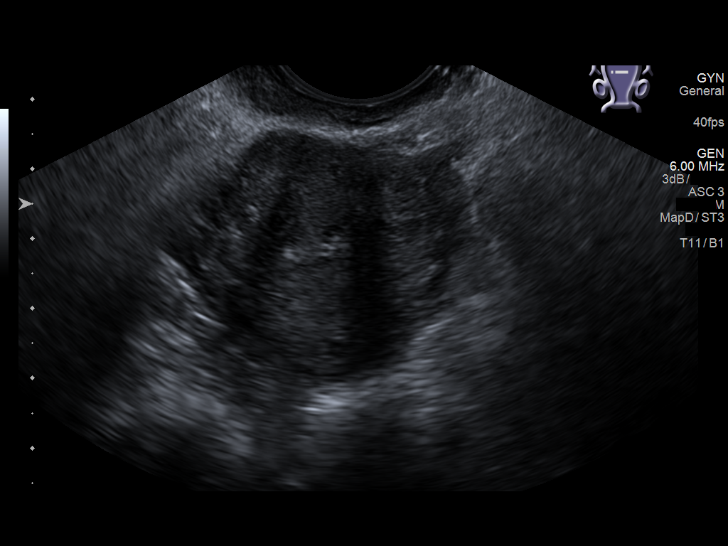
[im 53/79]
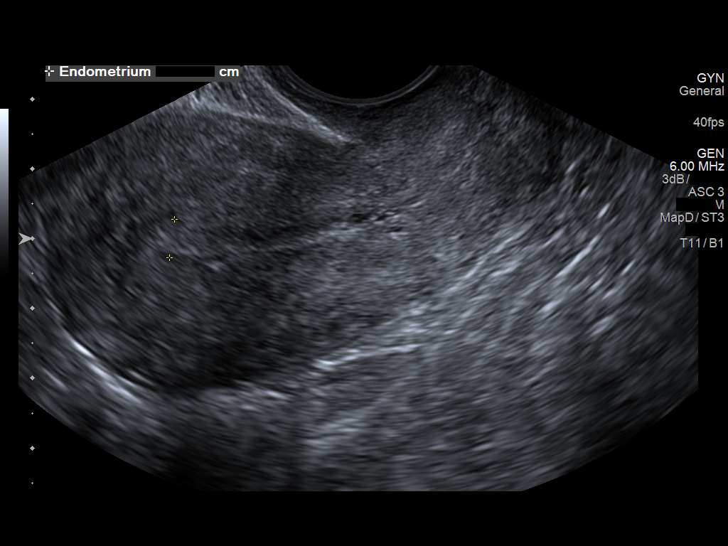
[im 59/79]
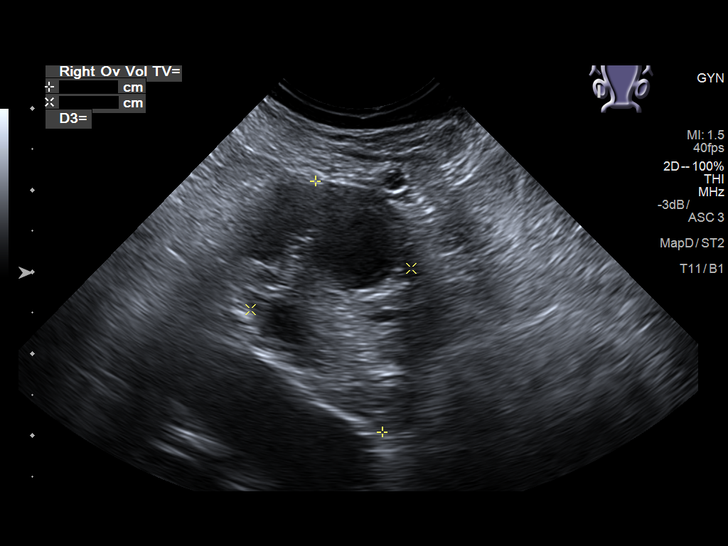
[im 66/79]
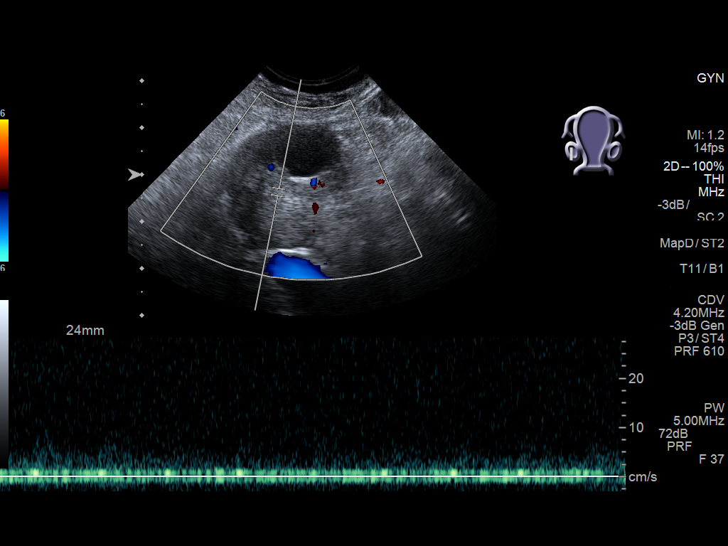
[im 72/79]
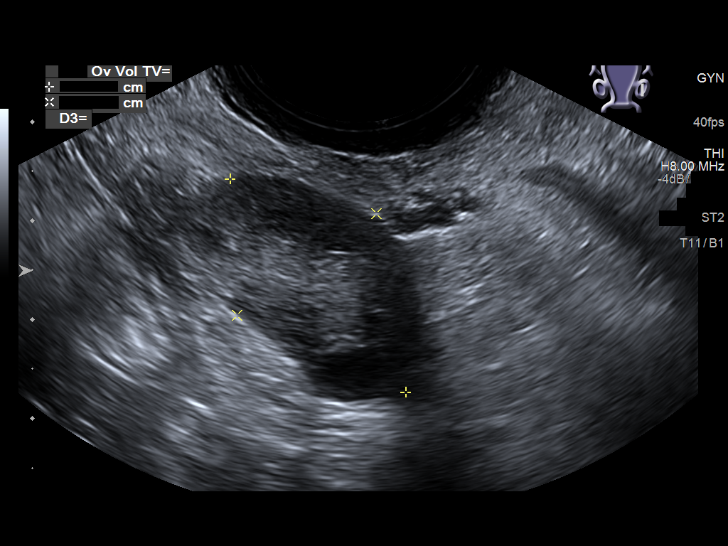
[im 79/79]
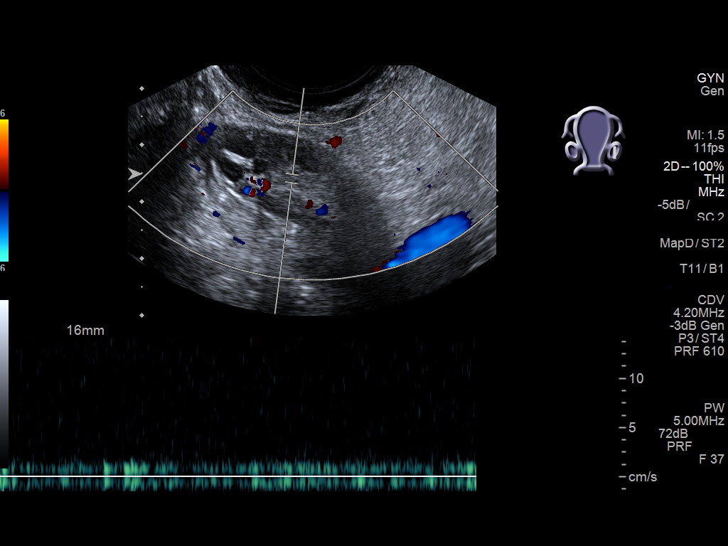

[13 of 25 positions shown; findings below may reference images not displayed]

FINDINGS: Uterus

Measurements: 7.7 x 4.5 x 4.7 cm = volume: 86.2 mL. No fibroids or
other mass visualized.

Endometrium

Thickness: 6 mm.  No focal abnormality visualized.

Right ovary

Measurements: 3.2 x 2.0 x 2.8 cm = volume: 9.4 mL. Normal
appearance/no adnexal mass.m

Left ovary

Measurements: 2.8 x 1.7 x 2.4 = volume: 6.0 mL. Normal appearance/no
adnexal mass.

Pulsed Doppler evaluation of both ovaries demonstrates normal
low-resistance arterial and venous waveforms.

Other findings

No abnormal free fluid.
IMPRESSION: 1. Normal study. No cause for the patient's symptoms identified. No
evidence of torsion.

## 2019-02-18 ENCOUNTER — Other Ambulatory Visit: Payer: Self-pay

## 2019-02-18 ENCOUNTER — Emergency Department (HOSPITAL_BASED_OUTPATIENT_CLINIC_OR_DEPARTMENT_OTHER): Payer: Medicaid Other

## 2019-02-18 ENCOUNTER — Encounter (HOSPITAL_BASED_OUTPATIENT_CLINIC_OR_DEPARTMENT_OTHER): Payer: Self-pay | Admitting: *Deleted

## 2019-02-18 ENCOUNTER — Emergency Department (HOSPITAL_BASED_OUTPATIENT_CLINIC_OR_DEPARTMENT_OTHER)
Admission: EM | Admit: 2019-02-18 | Discharge: 2019-02-19 | Disposition: A | Discharge status: Home / Self Care | Payer: Medicaid Other | Attending: Emergency Medicine | Admitting: Emergency Medicine

## 2019-02-18 DIAGNOSIS — Y93I9 Activity, other involving external motion: Secondary | ICD-10-CM | POA: Insufficient documentation

## 2019-02-18 DIAGNOSIS — Y999 Unspecified external cause status: Secondary | ICD-10-CM | POA: Insufficient documentation

## 2019-02-18 DIAGNOSIS — Z79899 Other long term (current) drug therapy: Secondary | ICD-10-CM | POA: Diagnosis not present

## 2019-02-18 DIAGNOSIS — Z793 Long term (current) use of hormonal contraceptives: Secondary | ICD-10-CM | POA: Diagnosis not present

## 2019-02-18 DIAGNOSIS — S0990XA Unspecified injury of head, initial encounter: Secondary | ICD-10-CM | POA: Diagnosis present

## 2019-02-18 DIAGNOSIS — Y92410 Unspecified street and highway as the place of occurrence of the external cause: Secondary | ICD-10-CM | POA: Insufficient documentation

## 2019-02-18 DIAGNOSIS — F17210 Nicotine dependence, cigarettes, uncomplicated: Secondary | ICD-10-CM | POA: Diagnosis not present

## 2019-02-18 DIAGNOSIS — S0003XA Contusion of scalp, initial encounter: Secondary | ICD-10-CM | POA: Insufficient documentation
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
## 2019-02-18 NOTE — ED Notes (Signed)
ED Provider at bedside. 

## 2019-02-19 ENCOUNTER — Emergency Department (HOSPITAL_BASED_OUTPATIENT_CLINIC_OR_DEPARTMENT_OTHER): Payer: Medicaid Other

## 2019-02-19 ENCOUNTER — Encounter (HOSPITAL_BASED_OUTPATIENT_CLINIC_OR_DEPARTMENT_OTHER): Payer: Self-pay | Admitting: Emergency Medicine

## 2019-02-19 LAB — PREGNANCY, URINE: Preg Test, Ur: NEGATIVE

## 2019-02-19 MED ORDER — KETOROLAC TROMETHAMINE 60 MG/2ML IM SOLN
60.0000 mg | Freq: Once | INTRAMUSCULAR | Status: AC
  Administered 2019-02-19: 60 mg via INTRAMUSCULAR
  Filled 2019-02-19: qty 2

## 2019-02-19 MED ORDER — NAPROXEN 500 MG PO TABS: 500.0000 mg | ORAL_TABLET | Freq: Two times a day (BID) | ORAL | 0 refills | Status: AC

## 2019-02-19 MED ORDER — LIDOCAINE 5 % EX PTCH: 1.0000 | MEDICATED_PATCH | CUTANEOUS | 0 refills | Status: AC
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
## 2019-02-20 ENCOUNTER — Emergency Department (HOSPITAL_BASED_OUTPATIENT_CLINIC_OR_DEPARTMENT_OTHER)
Admission: EM | Admit: 2019-02-20 | Discharge: 2019-02-20 | Disposition: A | Discharge status: Home / Self Care | Payer: Medicaid Other | Attending: Emergency Medicine | Admitting: Emergency Medicine

## 2019-02-20 ENCOUNTER — Emergency Department (HOSPITAL_BASED_OUTPATIENT_CLINIC_OR_DEPARTMENT_OTHER): Payer: Medicaid Other

## 2019-02-20 ENCOUNTER — Encounter (HOSPITAL_BASED_OUTPATIENT_CLINIC_OR_DEPARTMENT_OTHER): Payer: Self-pay | Admitting: *Deleted

## 2019-02-20 ENCOUNTER — Other Ambulatory Visit: Payer: Self-pay

## 2019-02-20 DIAGNOSIS — Z79899 Other long term (current) drug therapy: Secondary | ICD-10-CM | POA: Diagnosis not present

## 2019-02-20 DIAGNOSIS — Y999 Unspecified external cause status: Secondary | ICD-10-CM | POA: Diagnosis not present

## 2019-02-20 DIAGNOSIS — Y92410 Unspecified street and highway as the place of occurrence of the external cause: Secondary | ICD-10-CM | POA: Diagnosis not present

## 2019-02-20 DIAGNOSIS — Y939 Activity, unspecified: Secondary | ICD-10-CM | POA: Insufficient documentation

## 2019-02-20 DIAGNOSIS — S0083XA Contusion of other part of head, initial encounter: Secondary | ICD-10-CM | POA: Diagnosis not present

## 2019-02-20 DIAGNOSIS — T148XXA Other injury of unspecified body region, initial encounter: Secondary | ICD-10-CM

## 2019-02-20 DIAGNOSIS — F17210 Nicotine dependence, cigarettes, uncomplicated: Secondary | ICD-10-CM | POA: Diagnosis not present

## 2019-02-20 DIAGNOSIS — R220 Localized swelling, mass and lump, head: Secondary | ICD-10-CM | POA: Diagnosis not present

## 2019-02-20 MED ORDER — OXYCODONE-ACETAMINOPHEN 5-325 MG PO TABS
1.0000 | ORAL_TABLET | Freq: Once | ORAL | Status: AC
  Administered 2019-02-20: 17:00:00 1 via ORAL
  Filled 2019-02-20: qty 1
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
## 2019-02-20 NOTE — ED Notes (Signed)
Presents today with further increase in body aches/ pain, also for evaluation of bilateral eye swelling, states has blurred vision

## 2019-05-04 ENCOUNTER — Encounter (HOSPITAL_BASED_OUTPATIENT_CLINIC_OR_DEPARTMENT_OTHER): Payer: Self-pay

## 2019-05-04 ENCOUNTER — Other Ambulatory Visit: Payer: Self-pay

## 2019-05-04 ENCOUNTER — Emergency Department (HOSPITAL_BASED_OUTPATIENT_CLINIC_OR_DEPARTMENT_OTHER)
Admission: EM | Admit: 2019-05-04 | Discharge: 2019-05-04 | Disposition: A | Discharge status: Home / Self Care | Payer: Medicaid Other | Attending: Emergency Medicine | Admitting: Emergency Medicine

## 2019-05-04 DIAGNOSIS — N898 Other specified noninflammatory disorders of vagina: Secondary | ICD-10-CM

## 2019-05-04 DIAGNOSIS — Z202 Contact with and (suspected) exposure to infections with a predominantly sexual mode of transmission: Secondary | ICD-10-CM | POA: Diagnosis present

## 2019-05-04 DIAGNOSIS — F17210 Nicotine dependence, cigarettes, uncomplicated: Secondary | ICD-10-CM | POA: Insufficient documentation

## 2019-05-04 DIAGNOSIS — I10 Essential (primary) hypertension: Secondary | ICD-10-CM | POA: Insufficient documentation

## 2019-05-04 DIAGNOSIS — Z79899 Other long term (current) drug therapy: Secondary | ICD-10-CM | POA: Insufficient documentation

## 2019-05-04 LAB — URINALYSIS, ROUTINE W REFLEX MICROSCOPIC
Bilirubin Urine: NEGATIVE
Glucose, UA: NEGATIVE mg/dL
Ketones, ur: NEGATIVE mg/dL
Nitrite: NEGATIVE
Protein, ur: NEGATIVE mg/dL
Specific Gravity, Urine: >1.03 — ABNORMAL HIGH (ref 1.005–1.030)
pH: 6 (ref 5.0–8.0)

## 2019-05-04 LAB — WET PREP, GENITAL
Clue Cells Wet Prep HPF POC: NONE SEEN
Sperm: NONE SEEN
Yeast Wet Prep HPF POC: NONE SEEN

## 2019-05-04 LAB — URINALYSIS, MICROSCOPIC (REFLEX)

## 2019-05-04 LAB — PREGNANCY, URINE: Preg Test, Ur: NEGATIVE

## 2019-05-04 MED ORDER — METRONIDAZOLE 500 MG PO TABS
2000.0000 mg | ORAL_TABLET | Freq: Once | ORAL | Status: AC
  Administered 2019-05-04: 2000 mg via ORAL
  Filled 2019-05-04: qty 4

## 2019-05-04 MED ORDER — LIDOCAINE HCL (PF) 1 % IJ SOLN
INTRAMUSCULAR | Status: AC
  Administered 2019-05-04: 1 mL
  Filled 2019-05-04: qty 5

## 2019-05-04 MED ORDER — DOXYCYCLINE HYCLATE 100 MG PO CAPS: 100.0000 mg | ORAL_CAPSULE | Freq: Two times a day (BID) | ORAL | 0 refills | Status: AC

## 2019-05-04 MED ORDER — CEFTRIAXONE SODIUM 500 MG IJ SOLR
500.0000 mg | Freq: Once | INTRAMUSCULAR | Status: AC
  Administered 2019-05-04: 500 mg via INTRAMUSCULAR
  Filled 2019-05-04: qty 500
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
## 2019-05-04 NOTE — Discharge Instructions (Signed)
Follow up with [facility] STD clinic to be screened for HIV in the future and for future STD concerns or screenings. This is the recommendation by the CDC for people with multiple sexual partners or hx of STDs. You are being treated for chlamydia and trichomonas in the ER but the hospital will call you if lab is positive.  Do not have sexual intercourse for 7 days after antibiotic treatment.  Please take all of your antibiotics until finished!   Take your antibiotics with food.  Common side effects of antibiotics include nausea, vomiting, abdominal discomfort, and diarrhea. You may help offset some of this with probiotics which you can buy or get in yogurt. Do not eat  or take the probiotics until 2 hours after your antibiotic.    Some studies suggest that certain antibiotics can reduce the efficacy of certain oral contraceptive pills (birth control), so please use additional contraceptives (condoms or other barrier method) while you are taking the antibiotics and for an additional 5 to 7 days afterwards if you are a female on these medications.  If your blood pressure (BP) was elevated on multiple readings during this visit above 130 for the top number or above 80 for the bottom number, please have this repeated by your primary care provider within one month. You can also check your blood pressure when you are out at a pharmacy or grocery store. Many have machines that will check your blood pressure.  If your blood pressure remains elevated, please follow-up with your PCP.

## 2019-05-04 NOTE — ED Provider Notes (Signed)
[facility] EMERGENCY DEPARTMENT Provider Note   CSN: [id number] Arrival date & time: 05/04/19  1824     History Chief Complaint  Patient presents with  . SEXUALLY TRANSMITTED DISEASE    Natalie Russell is a 29 y.o. female with history of prior cholecystectomy presents for evaluation of vaginal discharge for 3 weeks.  She reports being told by her ex-boyfriend that he had tested positive for chlamydia and trichomonas 3 weeks ago but reports she did not seek evaluation at that time.  She has had vaginal discharge which was originally yellowish but now clear.  She reports mild intermittent lower abdominal/pelvic pains but states that this does not bother her very much and she has not tried anything for it.  She denies fevers, nausea, vomiting, urinary symptoms, diarrhea or constipation.  She was not using protection with this partner and she has had a new partner since then who she informed who is also here for testing and treatment.  The history is provided by the patient.       History reviewed. No pertinent past medical history.  There are no problems to display for this patient.   Past Surgical History:  Procedure Laterality Date  . CHOLECYSTECTOMY    . CHOLECYSTECTOMY N/A   . NASAL FRACTURE SURGERY    . RECONSTRUCTION OF NOSE       OB History    Gravida  3   Para  1   Term  1   Preterm  0   AB  1   Living  2     SAB  1   TAB  0   Ectopic  0   Multiple  0   Live Births  1           Family History  Problem Relation Age of Onset  . Diabetes Paternal Grandmother   . Heart disease Paternal Grandmother   . Hypertension Paternal Grandmother     Social History   Tobacco Use  . Smoking status: Heavy Tobacco Smoker    Packs/day: 0.50    Types: Cigarettes  . Smokeless tobacco: Never Used  Substance Use Topics  . Alcohol use: No  . Drug use: No    Home Medications Prior to Admission medications   Medication Sig Start Date End Date  Taking? Authorizing Provider  albuterol (PROVENTIL HFA;VENTOLIN HFA) 108 (90 BASE) MCG/ACT inhaler Inhale 2 puffs into the lungs every 4 (four) hours as needed for wheezing or shortness of breath. 05/14/14   Gregor Hams, MD  doxycycline (VIBRAMYCIN) 100 MG capsule Take 1 capsule (100 mg total) by mouth 2 (two) times daily for 7 days. 05/04/19 05/11/19  Rodell Perna A, PA-C  Levonorgestrel-Ethinyl Estradiol (AMETHIA,CAMRESE) 0.15-0.03 &0.01 MG tablet Take 1 tablet by mouth daily. 02/08/18   Emily Filbert, MD  lidocaine (LIDODERM) 5 % Place 1 patch onto the skin daily. Remove & Discard patch within 12 hours or as directed by MD 02/19/19   Randal Buba, April, MD  methocarbamol (ROBAXIN) 750 MG tablet Take 1-2 tablets (750-1,500 mg total) by mouth 3 (three) times daily as needed for muscle spasms. Patient not taking: Reported on 02/08/2018 10/04/17   Lorin Glass, PA-C  naproxen (NAPROSYN) 500 MG tablet Take 1 tablet (500 mg total) by mouth 2 (two) times daily with a meal. 02/19/19   Palumbo, April, MD  Prenatal Vit-Fe Fumarate-FA (PRENATAL MULTIVITAMIN) TABS tablet Take 1 tablet by mouth daily at 12 noon.    [provider]    Allergies    Wasp venom protein, Propoxyphene n-acetaminophen, Red dye, Diphenhydramine hcl, Hydrocodone-acetaminophen, and Hydrocodone-acetaminophen  Review of Systems   Review of Systems  Constitutional: Negative for chills and fever.  Respiratory: Negative for shortness of breath.   Cardiovascular: Negative for chest pain.  Gastrointestinal: Negative for abdominal pain, constipation, diarrhea, nausea and vomiting.  Genitourinary: Positive for pelvic pain and vaginal discharge. Negative for dysuria, frequency, vaginal bleeding and vaginal pain.  All other systems reviewed and are negative.   Physical Exam Updated Vital Signs BP (!) 164/118 (BP Location: Right Arm)   Pulse (!) 104   Temp 98.3 F (36.8 C) (Oral)   Resp 18   Ht 5\' 5"  (1.651 m)   Wt 104.3 kg    LMP 05/02/2019   SpO2 100%   BMI 38.27 kg/m   Physical Exam Vitals and nursing note reviewed.  Constitutional:      General: She is not in acute distress.    Appearance: She is well-developed.  HENT:     Head: Normocephalic and atraumatic.  Eyes:     General:        Right eye: No discharge.        Left eye: No discharge.     Conjunctiva/sclera: Conjunctivae normal.  Neck:     Vascular: No JVD.     Trachea: No tracheal deviation.  Cardiovascular:     Rate and Rhythm: Regular rhythm. Tachycardia present.     Comments: Very mildly tachycardic Pulmonary:     Effort: Pulmonary effort is normal.     Breath sounds: Normal breath sounds.  Abdominal:     General: Bowel sounds are normal. There is no distension.     Palpations: Abdomen is soft.     Tenderness: There is no abdominal tenderness. There is no right CVA tenderness, left CVA tenderness, guarding or rebound.  Genitourinary:    Comments: Examination performed in the presence of a chaperone.  No masses or lesions to the external genitalia.  Moderate amount of white discharge in the vaginal vault.  Mild cervical friability.  No cervical motion tenderness or adnexal tenderness. Musculoskeletal:     Cervical back: Neck supple.  Skin:    General: Skin is warm and dry.     Findings: Erythema present.     Comments: Erythema to the face and upper extremities and chest consistent with first-degree sunburn  Neurological:     Mental Status: She is alert.  Psychiatric:        Behavior: Behavior normal.     ED Results / Procedures / Treatments   Labs (all labs ordered are listed, but only abnormal results are displayed) Labs Reviewed  WET PREP, GENITAL - Abnormal; Notable for the following components:      Result Value   Trich, Wet Prep PRESENT (*)    WBC, Wet Prep HPF POC MANY (*)    All other components within normal limits  URINALYSIS, ROUTINE W REFLEX MICROSCOPIC - Abnormal; Notable for the following components:    APPearance CLOUDY (*)    Specific Gravity, Urine >1.030 (*)    Hgb urine dipstick MODERATE (*)    Leukocytes,Ua SMALL (*)    All other components within normal limits  URINALYSIS, MICROSCOPIC (REFLEX) - Abnormal; Notable for the following components:   Bacteria, UA FEW (*)    All other components within normal limits  PREGNANCY, URINE  RPR  HIV ANTIBODY (ROUTINE TESTING W REFLEX)  GC/CHLAMYDIA PROBE AMP ([place]) NOT  AT [facility]    EKG None  Radiology No results found.  Procedures Procedures (including critical care time)  Medications Ordered in ED Medications  cefTRIAXone (ROCEPHIN) injection 500 mg (500 mg Intramuscular Given 05/04/19 1957)  metroNIDAZOLE (FLAGYL) tablet 2,000 mg (2,000 mg Oral Given 05/04/19 2000)  lidocaine (PF) (XYLOCAINE) 1 % injection (1 mL  Given 05/04/19 1957)    ED Course  I have reviewed the triage vital signs and the nursing notes.  Pertinent labs & imaging results that were available during my care of the patient were reviewed by me and considered in my medical decision making (see chart for details).    MDM Rules/Calculators/A&P                      Patient presents requesting STD treatment after known exposure.  She is afebrile, mildly hypertensive and tachycardic but tells me that she has been out all day helping her uncle and she sustained a mild first-degree sunburn.  She thinks she is mildly dehydrated.  I encouraged her to drink fluids and recheck her heart rate and blood pressure at home and if they remain elevated to follow-up with her PCP and she verbalized understanding of and agreement with this plan.  Her abdomen is soft and nontender.  Pelvic examination is reassuring and I doubt PID, TOA ovarian torsion or ectopic pregnancy.  Wet prep is positive for trichomoniasis.  UA does not suggest UTI or nephrolithiasis but does suggest dehydration with elevated specific gravity.  She is tolerating p.o. fluids in the ED without difficulty.  Will  treat for her STD exposure with IM Rocephin and Flagyl in the ED, discharged with 1 week course of doxycycline per current CDC guidelines.  Discussed safe sex practices.  Cultures obtained including GC chlamydia, HIV and syphilis.  She understands she will receive a phone call if any of her results are positive.  Recommend future testing and treatment at the health department.  Discussed strict ED return precautions. Patient verbalized understanding of and agreement with plan and is safe for discharge home at this time.  Final Clinical Impression(s) / ED Diagnoses Final diagnoses:  Vaginal discharge  STD exposure  Hypertension, unspecified type    Rx / DC Orders ED Discharge Orders         Ordered    doxycycline (VIBRAMYCIN) 100 MG capsule  2 times daily     05/04/19 2015           Jeanie Sewer, PA-C 05/04/19 2035    Geoffery Lyons, MD 05/04/19 2306

## 2019-05-05 LAB — RPR
RPR Ser Ql: REACTIVE — AB
RPR Titer: 1:32 {titer}

## 2019-05-05 LAB — GC/CHLAMYDIA PROBE AMP (~~LOC~~) NOT AT ARMC
Chlamydia: POSITIVE — AB
Comment: NEGATIVE
Comment: NORMAL
Neisseria Gonorrhea: POSITIVE — AB

## 2019-05-05 LAB — HIV ANTIBODY (ROUTINE TESTING W REFLEX): HIV Screen 4th Generation wRfx: NONREACTIVE

## 2019-05-06 LAB — T.PALLIDUM AB, TOTAL: T Pallidum Abs: REACTIVE — AB
